# Patient Record
Sex: Male | Born: 1990
Health system: Southern US, Community
[De-identification: ages and names within clinical notes are randomized; demographics above are authoritative.]

## PROBLEM LIST (undated history)

## (undated) DIAGNOSIS — F1910 Other psychoactive substance abuse, uncomplicated: Secondary | ICD-10-CM

## (undated) DIAGNOSIS — N529 Male erectile dysfunction, unspecified: Secondary | ICD-10-CM

## (undated) DIAGNOSIS — E559 Vitamin D deficiency, unspecified: Secondary | ICD-10-CM

## (undated) DIAGNOSIS — E785 Hyperlipidemia, unspecified: Secondary | ICD-10-CM

## (undated) DIAGNOSIS — F419 Anxiety disorder, unspecified: Secondary | ICD-10-CM

## (undated) DIAGNOSIS — M419 Scoliosis, unspecified: Secondary | ICD-10-CM

## (undated) HISTORY — DX: Male erectile dysfunction, unspecified: N52.9

## (undated) HISTORY — DX: Hyperlipidemia, unspecified: E78.5

## (undated) HISTORY — DX: Other psychoactive substance abuse, uncomplicated: F19.10

## (undated) HISTORY — DX: Scoliosis, unspecified: M41.9

## (undated) HISTORY — DX: Vitamin D deficiency, unspecified: E55.9

## (undated) HISTORY — PX: WISDOM TOOTH EXTRACTION: SHX21

## (undated) HISTORY — DX: Anxiety disorder, unspecified: F41.9

---

## 2011-03-15 ENCOUNTER — Ambulatory Visit (INDEPENDENT_AMBULATORY_CARE_PROVIDER_SITE_OTHER): Payer: BC Managed Care – PPO | Admitting: Internal Medicine

## 2011-03-15 ENCOUNTER — Encounter: Payer: Self-pay | Admitting: Internal Medicine

## 2011-03-15 DIAGNOSIS — Z0000 Encounter for general adult medical examination without abnormal findings: Secondary | ICD-10-CM | POA: Insufficient documentation

## 2011-03-15 DIAGNOSIS — F1910 Other psychoactive substance abuse, uncomplicated: Secondary | ICD-10-CM | POA: Insufficient documentation

## 2011-03-15 DIAGNOSIS — J45909 Unspecified asthma, uncomplicated: Secondary | ICD-10-CM | POA: Insufficient documentation

## 2011-03-15 NOTE — Assessment & Plan Note (Addendum)
recommend  to get shot records Counseled:  STE diet, exercise, elevated  BMI Risk of tobacco Safe sex, safe driving Labs

## 2011-03-15 NOTE — Assessment & Plan Note (Signed)
Praised for getting help !

## 2011-03-15 NOTE — Progress Notes (Signed)
  Subjective:    Patient ID: Bryan Taylor, male    DOB: [birth year], 20 y.o.   MRN: [id number]  HPI CPX  Past Medical History  Diagnosis Date  . Drug abuse     Rx opioids, clean since  mid 2011  . Asthma    Past Surgical History  Procedure Date  . Wisdom tooth extraction    History   Social History  . Marital Status: Single    Spouse Name: N/A    Number of Children: 0  . Years of Education: N/A   Occupational History  . student [employer], has ajob    Social History Main Topics  . Smoking status: Current Everyday Smoker -- 1.0 packs/day    Types: Cigarettes  . Smokeless tobacco: Not on file  . Alcohol Use: No  . Drug Use: No     no recent   . Sexually Active: Not on file   Other Topics Concern  . Not on file   Social History Narrative  . No narrative on file   Family History  Problem Relation Age of Onset  . Diabetes Neg Hx   . Coronary artery disease Neg Hx   . Colon cancer Neg Hx   . Prostate cancer Neg Hx      Review of Systems  Constitutional: Negative for fever, fatigue and unexpected weight change.  Respiratory: Negative for cough and wheezing.   Cardiovascular: Negative for chest pain and leg swelling.  Gastrointestinal: Negative for abdominal pain and blood in stool.  Genitourinary: Negative for hematuria and difficulty urinating.  Psychiatric/Behavioral:       No anxiety-depression       Objective:   Physical Exam  Constitutional: He is oriented to person, place, and time. He appears well-developed. No distress.       Obese appearing  HENT:  Head: Normocephalic and atraumatic.  Neck: No thyromegaly present.  Cardiovascular: Normal rate, regular rhythm and normal heart sounds.   No murmur heard. Pulmonary/Chest: Effort normal and breath sounds normal. No respiratory distress. He has no wheezes. He has no rales.  Abdominal: Soft. Bowel sounds are normal. He exhibits no distension. There is no tenderness. There is no rebound and no guarding.    Musculoskeletal: He exhibits no edema.  Neurological: He is alert and oriented to person, place, and time.  Skin: Skin is warm and dry. He is not diaphoretic.  Psychiatric: He has a normal mood and affect. His behavior is normal. Judgment and thought content normal.          Assessment & Plan:

## 2011-03-15 NOTE — Assessment & Plan Note (Signed)
Only exacerbated if exposed to certain pets

## 2011-03-16 LAB — CBC WITH DIFFERENTIAL/PLATELET
Basophils Relative: 0.8 % (ref 0.0–3.0)
Eosinophils Relative: 1.8 % (ref 0.0–5.0)
Lymphocytes Relative: 41.5 % (ref 12.0–46.0)
MCV: 88.9 fl (ref 78.0–100.0)
Monocytes Absolute: 0.5 10*3/uL (ref 0.1–1.0)
Monocytes Relative: 9.7 % (ref 3.0–12.0)
Neutrophils Relative %: 46.2 % (ref 43.0–77.0)
RBC: 5.16 Mil/uL (ref 4.22–5.81)
WBC: 5.3 10*3/uL (ref 4.5–10.5)

## 2011-03-16 LAB — COMPREHENSIVE METABOLIC PANEL
Albumin: 4.7 g/dL (ref 3.5–5.2)
BUN: 13 mg/dL (ref 6–23)
CO2: 26 mEq/L (ref 19–32)
Calcium: 9.6 mg/dL (ref 8.4–10.5)
Chloride: 105 mEq/L (ref 96–112)
Glucose, Bld: 95 mg/dL (ref 70–99)
Potassium: 4.6 mEq/L (ref 3.5–5.1)
Total Protein: 7.7 g/dL (ref 6.0–8.3)

## 2011-03-16 LAB — TSH: TSH: 0.79 u[IU]/mL (ref 0.35–5.50)

## 2011-03-16 LAB — CHOLESTEROL, TOTAL: Cholesterol: 174 mg/dL (ref 0–200)

## 2011-04-11 ENCOUNTER — Telehealth: Payer: Self-pay | Admitting: Internal Medicine

## 2011-04-11 NOTE — Telephone Encounter (Signed)
Noted, please call him tomorrow if he does not keep his appointment

## 2011-04-11 NOTE — Telephone Encounter (Signed)
Pt c/o swelling and discomfort in scrotum area x weeks . Pt denies any recent injury, redness, urinary issue, tenderness or extreme pain. Pt scheduled for OV tomorrow but advise if symptoms worsen he needs to be evaluated in ED/UC. Pt ok

## 2011-04-12 ENCOUNTER — Ambulatory Visit (HOSPITAL_BASED_OUTPATIENT_CLINIC_OR_DEPARTMENT_OTHER)
Admission: RE | Admit: 2011-04-12 | Discharge: 2011-04-12 | Disposition: A | Discharge status: Home / Self Care | Payer: BC Managed Care – PPO | Source: Ambulatory Visit | Attending: Internal Medicine | Admitting: Internal Medicine

## 2011-04-12 ENCOUNTER — Other Ambulatory Visit: Payer: Self-pay | Admitting: Internal Medicine

## 2011-04-12 ENCOUNTER — Ambulatory Visit (INDEPENDENT_AMBULATORY_CARE_PROVIDER_SITE_OTHER)
Admission: RE | Admit: 2011-04-12 | Discharge: 2011-04-12 | Disposition: A | Discharge status: Home / Self Care | Payer: BC Managed Care – PPO | Source: Ambulatory Visit | Attending: Internal Medicine | Admitting: Internal Medicine

## 2011-04-12 ENCOUNTER — Ambulatory Visit (INDEPENDENT_AMBULATORY_CARE_PROVIDER_SITE_OTHER): Payer: BC Managed Care – PPO | Admitting: Internal Medicine

## 2011-04-12 ENCOUNTER — Encounter: Payer: Self-pay | Admitting: Internal Medicine

## 2011-04-12 VITALS — BP 136/82 | HR 98 | Temp 97.9°F | Resp 16 | Wt 226.0 lb

## 2011-04-12 DIAGNOSIS — N509 Disorder of male genital organs, unspecified: Secondary | ICD-10-CM

## 2011-04-12 DIAGNOSIS — N50819 Testicular pain, unspecified: Secondary | ICD-10-CM | POA: Insufficient documentation

## 2011-04-12 DIAGNOSIS — N508 Other specified disorders of male genital organs: Secondary | ICD-10-CM | POA: Insufficient documentation

## 2011-04-12 LAB — POCT URINALYSIS DIPSTICK
Blood, UA: NEGATIVE
Nitrite, UA: NEGATIVE
Spec Grav, UA: 1.025
Urobilinogen, UA: 0.2
pH, UA: 5

## 2011-04-12 MED ORDER — CIPROFLOXACIN HCL 500 MG PO TABS: 500.0000 mg | ORAL_TABLET | Freq: Two times a day (BID) | ORAL | Status: DC

## 2011-04-12 NOTE — Progress Notes (Signed)
  Subjective:    Patient ID: Bryan Taylor, male    DOB: September 05, 1990, 20 y.o.   MRN: [id number]  HPI 3 weeks history of on and off discomfort for the right testicle, it seems to be slightly swollen on and off, mostly at the posterior aspect of the testicle. No history of previous problems like that, no history of recent injury, is not taking any medication for it. Denies been recently sexually active.   Past Medical History  Diagnosis Date  . Drug abuse     Rx opioids, clean since  mid 2011  . Asthma    Past Surgical History  Procedure Date  . Wisdom tooth extraction       Review of Systems Denies fever or chills, no genital rash. No nausea or vomiting. Occasional diarrhea which is nothing to do for pain. No dysuria, gross hematuria, penile discharge      Objective:   Physical Exam  Constitutional: He is oriented to person, place, and time. He appears well-developed and well-nourished.  Abdominal: Soft. Bowel sounds are normal. He exhibits no distension. There is no rebound and no guarding.       Mild tenderness at the left lower quadrant without mass or rebound initially; inconsistent finding, when I reexamined the area there was  No tenderness  Genitourinary:       Growing without lymphadenopathies or hernia. Penis without discharge or rash Right testicle is slightly larger than the left, slightly more sensitive than L but not really tender. Epididymis seems normal except for the fact that is more sensitive. Left testicle normal  Musculoskeletal: He exhibits no edema.  Neurological: He is alert and oriented to person, place, and time.  Skin: Skin is warm and dry.  Psychiatric: He has a normal mood and affect. His behavior is normal. Judgment and thought content normal.          Assessment & Plan:

## 2011-04-12 NOTE — Assessment & Plan Note (Signed)
3 weeks history of right testicular discomfort, on exam the right testicle is slightly more sensitive. It is also larger, a chronic finding according to the patient. DDX subacute orchitis, epididymitis versus other etiologies. Plan: UA, urine culture, D&C Start empiric treatment with antibiotics Ultrasound

## 2011-04-12 NOTE — Telephone Encounter (Signed)
Pt arrived for OV today.

## 2011-04-12 NOTE — Patient Instructions (Signed)
Call if no better in few days, call if you are not 100% better in 3 weeks

## 2011-04-14 LAB — CULTURE, URINE COMPREHENSIVE

## 2011-04-15 ENCOUNTER — Other Ambulatory Visit: Payer: Self-pay | Admitting: *Deleted

## 2011-04-15 DIAGNOSIS — N390 Urinary tract infection, site not specified: Secondary | ICD-10-CM

## 2011-04-15 MED ORDER — CIPROFLOXACIN HCL 500 MG PO TABS: 500.0000 mg | ORAL_TABLET | Freq: Two times a day (BID) | ORAL | Status: AC

## 2011-04-29 ENCOUNTER — Telehealth: Payer: Self-pay | Admitting: *Deleted

## 2011-04-29 NOTE — Telephone Encounter (Signed)
Spoke w/patient about future Urine Culture-finished ABX yesterday and has had a little discomfort as before. Explained that he should give himself a couple of days after finishing ABX & if he experienced any symptoms as before, to make lab appointment [order has been placed] and come in earlier than the one month recheck as directed at 04/15/11 w/lab results.

## 2011-05-05 ENCOUNTER — Other Ambulatory Visit: Payer: Self-pay | Admitting: *Deleted

## 2011-05-05 DIAGNOSIS — N390 Urinary tract infection, site not specified: Secondary | ICD-10-CM

## 2011-05-06 ENCOUNTER — Other Ambulatory Visit (INDEPENDENT_AMBULATORY_CARE_PROVIDER_SITE_OTHER): Payer: BC Managed Care – PPO

## 2011-05-06 DIAGNOSIS — N390 Urinary tract infection, site not specified: Secondary | ICD-10-CM

## 2011-05-06 LAB — POCT URINALYSIS DIPSTICK
Glucose, UA: NEGATIVE
Leukocytes, UA: NEGATIVE
Nitrite, UA: NEGATIVE
Protein, UA: NEGATIVE
Urobilinogen, UA: 0.2

## 2011-05-06 NOTE — Progress Notes (Signed)
Labs only

## 2011-05-08 LAB — CULTURE, URINE COMPREHENSIVE
Colony Count: NO GROWTH
Organism ID, Bacteria: NO GROWTH

## 2011-05-09 NOTE — Progress Notes (Signed)
Quick Note:  Pt aware ______ 

## 2012-01-19 IMAGING — US US SCROTUM
1 series · 13 of 25 positions shown · non-contrast
Comparison: None.

CLINICAL DATA: Bilateral testicular discomfort for three weeks,
right greater than left, no known injury

SCROTAL ULTRASOUND
DOPPLER ULTRASOUND OF THE TESTICLES
TECHNIQUE: Complete ultrasound examination of the testicles,
epididymis, and other scrotal structures was performed.  Color and
spectral Doppler ultrasound were also utilized to evaluate blood
flow to the testicles.

[Series 1: us scrotum · 0.08mm/px · 13 of 27 slices shown]
[im 1/27]
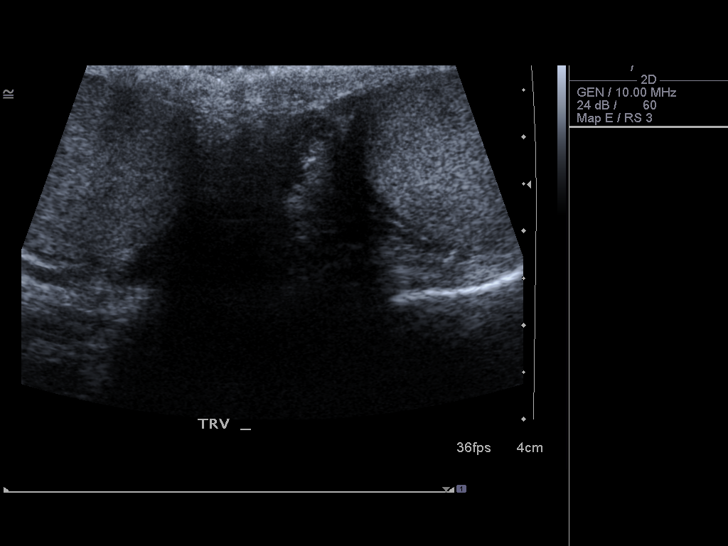
[im 3/27]
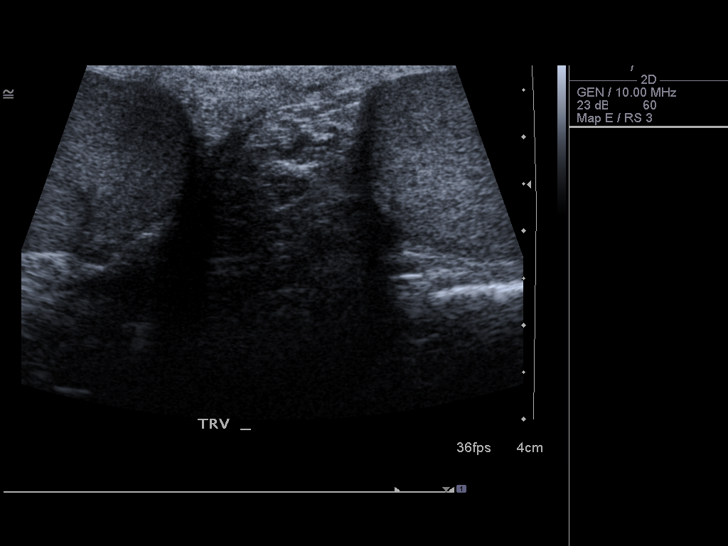
[im 5/27]
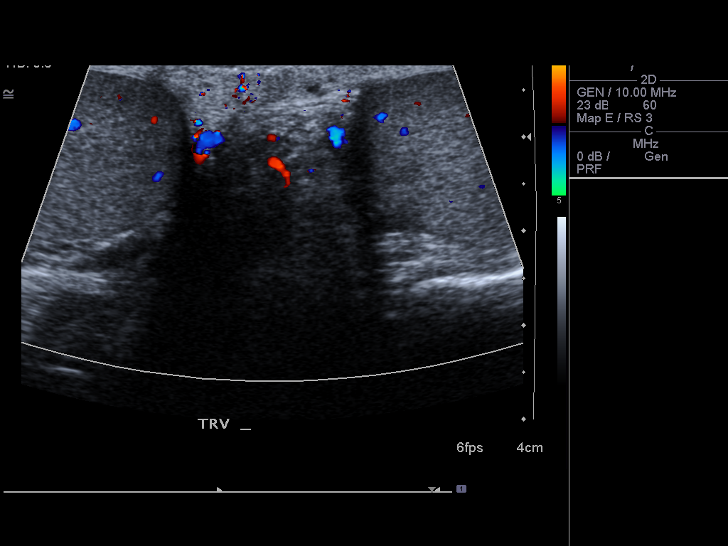
[im 7/27]
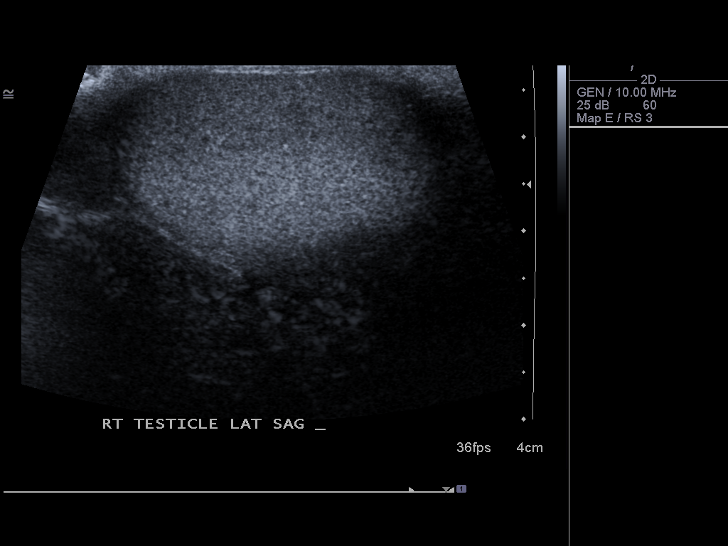
[im 9/27]
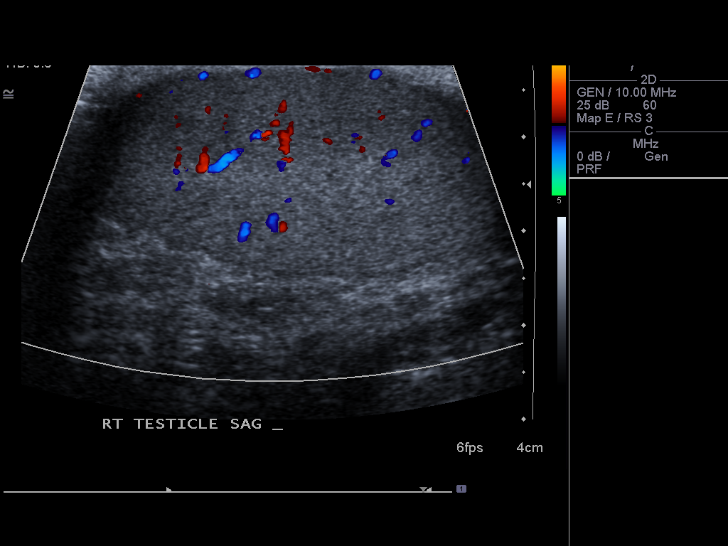
[im 11/27]
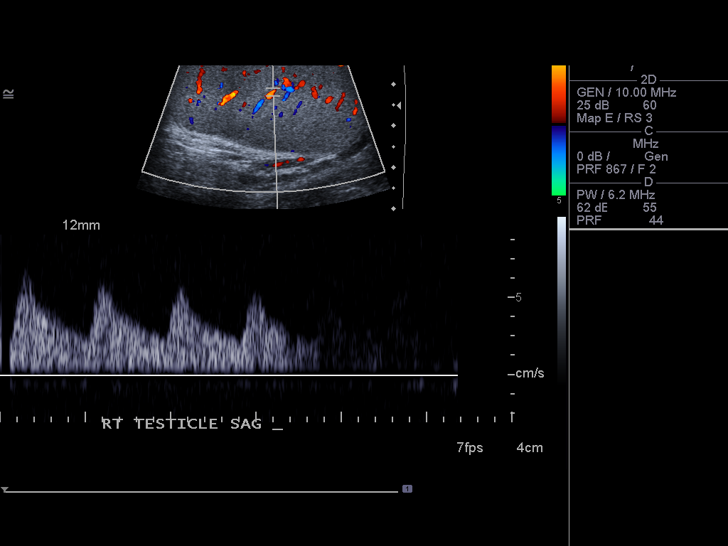
[im 14/27]
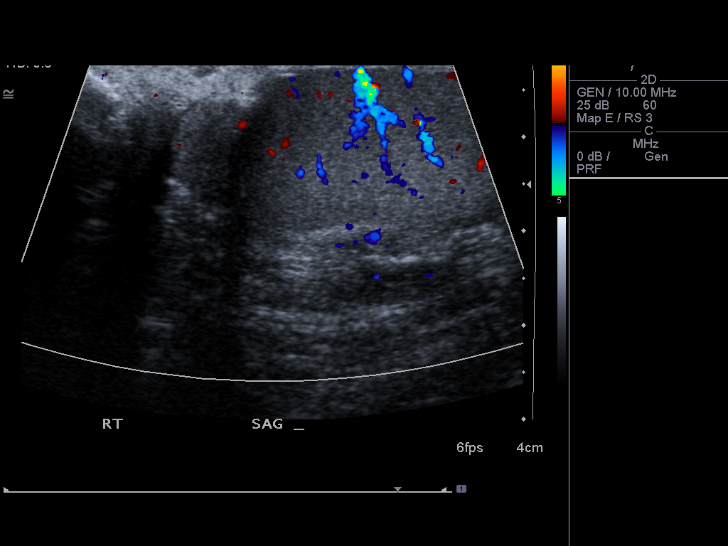
[im 16/27]
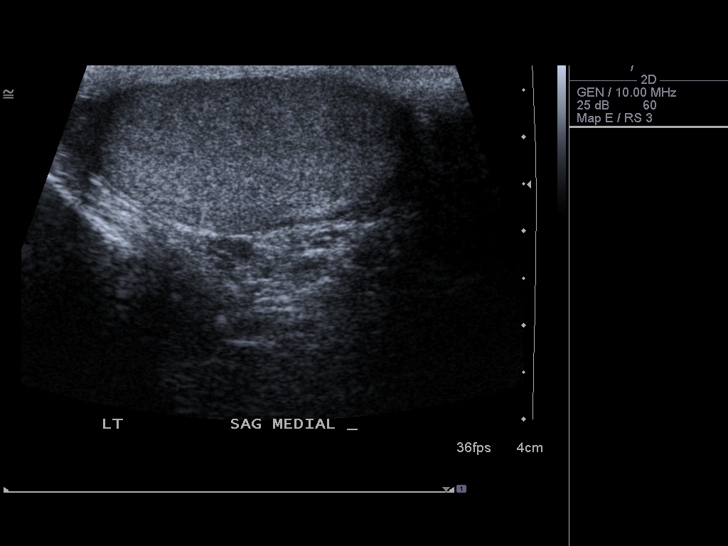
[im 18/27]
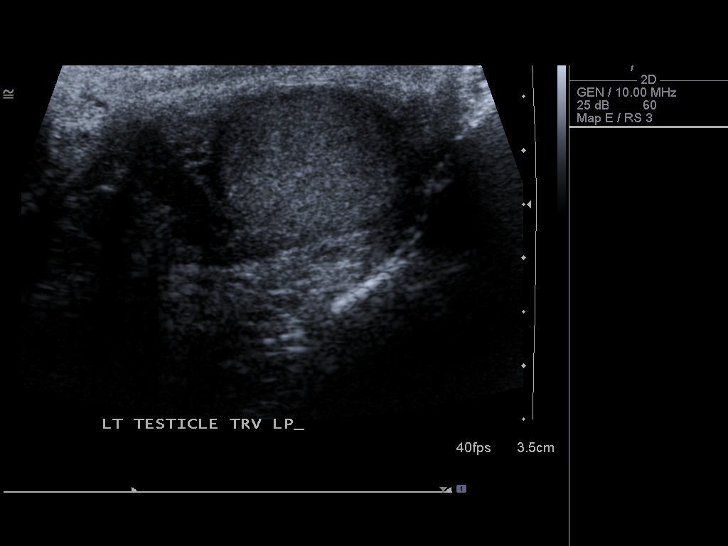
[im 20/27]
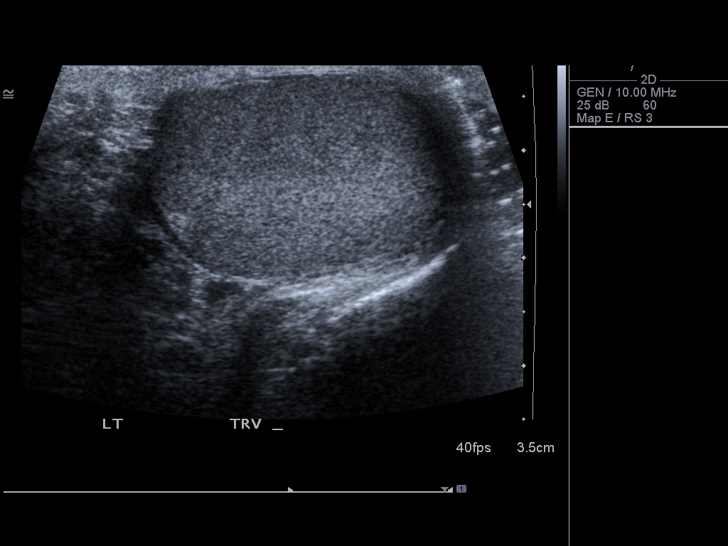
[im 22/27]
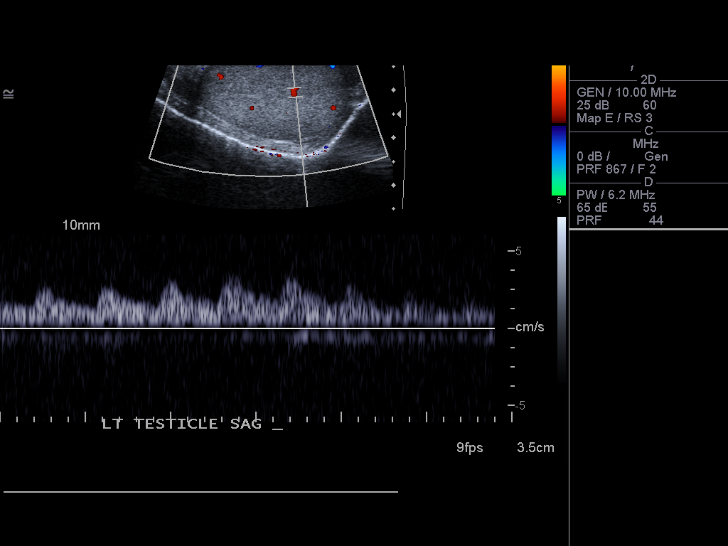
[im 24/27]
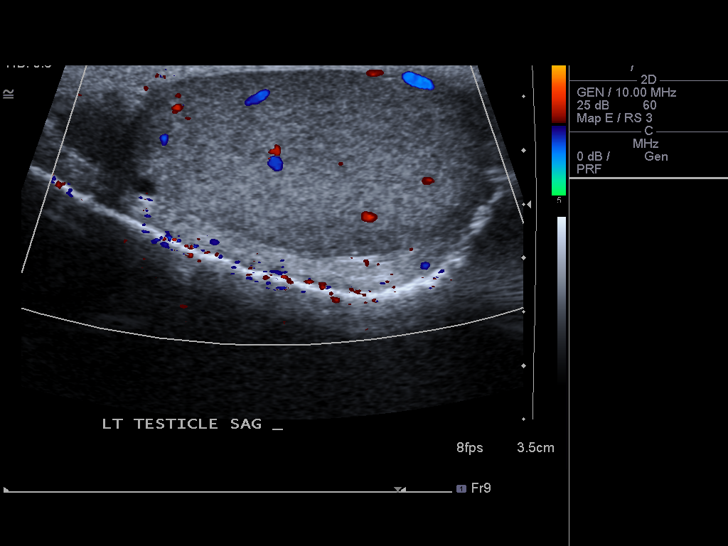
[im 27/27]
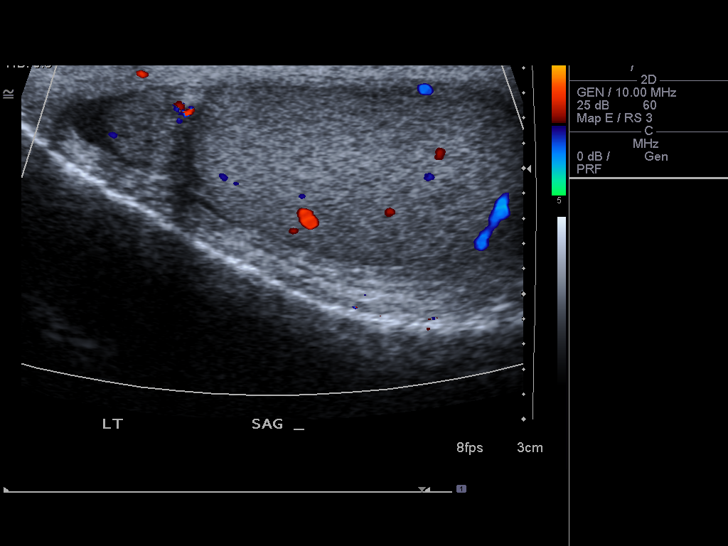

[13 of 25 positions shown; findings below may reference images not displayed]

FINDINGS: The right testicle demonstrates homogeneous echotexture and is
normal in size, measuring 4.3 x 2.5 x 3.1 cm.  No discrete hyper or
hypoechoic intratesticular mass identified.  Normal arterial and
venous waveforms are documented within the right testicle. The
right epididymis is normal in size and echotexture.   No right-
sided hydrocele or varicocele.

The left testicle demonstrates homogeneous echotexture and is
normal in size, measuring 4.1 x 2.0 x 2.2 cm.  No discrete hyper or
hypoechoic intracystic mass identified.  Normal arterial and venous
waveforms are documented within the left testicle. The left
epididymis is normal in size and echotexture.  No left-sided
hydrocele or varicocele.
IMPRESSION: No explanation for patient's bilateral testicular pain,
specifically, no evidence of testicular mass or torsion.

## 2013-03-20 ENCOUNTER — Encounter: Payer: BC Managed Care – PPO | Admitting: Internal Medicine

## 2013-03-22 ENCOUNTER — Encounter: Payer: Self-pay | Admitting: Internal Medicine

## 2013-03-22 ENCOUNTER — Ambulatory Visit (INDEPENDENT_AMBULATORY_CARE_PROVIDER_SITE_OTHER): Payer: BC Managed Care – PPO | Admitting: Internal Medicine

## 2013-03-22 VITALS — BP 120/70 | HR 91 | Temp 98.2°F | Ht 72.75 in | Wt 179.4 lb

## 2013-03-22 DIAGNOSIS — Z0000 Encounter for general adult medical examination without abnormal findings: Secondary | ICD-10-CM

## 2013-03-22 LAB — COMPREHENSIVE METABOLIC PANEL
BUN: 11 mg/dL (ref 6–23)
CO2: 29 mEq/L (ref 19–32)
Creatinine, Ser: 0.9 mg/dL (ref 0.4–1.5)
GFR: 117.03 mL/min (ref >60.00)
Glucose, Bld: 77 mg/dL (ref 70–99)
Sodium: 140 mEq/L (ref 135–145)
Total Bilirubin: 0.9 mg/dL (ref 0.3–1.2)
Total Protein: 8 g/dL (ref 6.0–8.3)

## 2013-03-22 LAB — CHOLESTEROL, TOTAL: Cholesterol: 221 mg/dL — ABNORMAL HIGH (ref 0–200)

## 2013-03-22 NOTE — Patient Instructions (Addendum)
Safe Sex Safe sex is about reducing the risk of giving or getting a sexually transmitted disease (STD). STDs are spread through sexual contact involving the genitals, mouth, or rectum. Some STDS can be cured and others cannot. Safe sex can also prevent unintended pregnancies.  SAFE SEX PRACTICES  Limit your sexual activity to only one partner who is only having sex with you.  Talk to your partner about their past partners, past STDs, and drug use.  Use a condom every time you have sexual intercourse. This includes vaginal, oral, and anal sexual activity. Both females and males should wear condoms during oral sex. Only use latex or polyurethane condoms and water-based lubricants. Petroleum-based lubricants or oils used to lubricate a condom will weaken the condom and increase the chance that it will break. The condom should be in place from the beginning to the end of sexual activity. Wearing a condom reduces, but does not completely eliminate, your risk of getting or giving a STD. STDs can be spread by contact with skin of surrounding areas.  Get vaccinated for hepatitis B and HPV.  Avoid alcohol and recreational drugs which can affect your judgement. You may forget to use a condom or participate in high-risk sex.  For females, avoid douching after sexual intercourse. Douching can spread an infection farther into the reproductive tract.  Check your body for signs of sores, blisters, rashes, or unusual discharge. See your caregiver if you notice any of these signs.  Avoid sexual contact if you have symptoms of an infection or are being treated for an STD. If you or your partner has herpes, avoid sexual contact when blisters are present. Use condoms at all other times.  See your caregiver for regular screenings, examinations, and tests for STDs. Before having sex with a new partner, each of you should be screened for STDs and talk about the results with your partner. BENEFITS OF SAFE SEX    There is less of a chance of getting or giving an STD.  You can prevent unwanted or unintended pregnancies.  By discussing safer sex concerns with your partner, you may increase feelings of intimacy, comfort, trust, and honesty between the both of you. Document Released: 09/15/2004 Document Revised: 05/02/2012 Document Reviewed: 01/30/2012 [facility] Patient Information 2014 [place], [place].  Testicular Problems and Self-Exam Men can examine themselves easily and effectively with positive results. Monthly exams detect problems early and save lives. There are numerous causes of swelling in the testicle. Testicular cancer usually appears as a firm painless lump in the front part of the testicle. This may feel like a dull ache or heavy feeling located in the lower abdomen (belly), groin, or scrotum.  The risk is greater in men with undescended testicles and it is more common in young men. It is responsible for almost a fifth of cancers in males between ages 73 and 75. Other common causes of swellings, lumps, and testicular pain include injuries, inflammation (soreness) from infection, hydrocele, and torsion. These are a few of the reasons to do monthly self-examination of the testicles. The exam only takes minutes and could add years to your life. Get in the habit! SELF-EXAMINATION OF THE TESTICLES The testicles are easiest to examine after warm baths or showers and are more difficult to examine when you are cold. This is because the muscles attached to the testicles retract and pull them up higher or into the abdomen. While standing, roll one testicle between the thumb and forefinger. Feel for lumps, swelling,  or discomfort. A normal testicle is egg shaped and feels firm. It is smooth and not tender. The spermatic cord can be felt as a firm spaghetti-like cord at the back of the testicle. It is also important to examine your groins. This is the crease between the front of your leg and your abdomen. Also,  feel for enlarged lymph nodes (glands). Enlarged nodes are also a cause for you to see your caregiver for evaluation.  Self-examination of the testicles and groin areas on a regular basis will help you to know what your own testicles and groins feel like. This will help you pick up an abnormality (difference) at an earlier stage. Early discovery is the key to curing this cancer or treating other conditions. Any lump, change, or swelling in the testicle calls for immediate evaluation by your caregiver. Cancer of the testicle does not result in impotence and it does not prevent normal intercourse or prevent having children. If your caregiver feels that medical treatment or chemotherapy could lead to infertility, sperm can be frozen for future use. It is necessary to see a caregiver as soon as possible after the discovery of a lump in a testicle. Document Released: 11/14/2000 Document Revised: 10/31/2011 Document Reviewed: 08/09/2008 [facility] Patient Information 2014 [place], [place].

## 2013-03-22 NOTE — Assessment & Plan Note (Addendum)
Immunizations record reviewed and scanned. Not update on Tdap per records but states he had one < few years ago  Labs (not fasting) Counseled: diet-exercise-safe driving-safe sex Doing great as far staying away from substance abuse--- praised

## 2013-03-22 NOTE — Progress Notes (Signed)
  Subjective:    Patient ID: Bryan Taylor, male    DOB: 1991-03-04, 22 y.o.   MRN: [id number]  HPI Complete physical exam  Past Medical History  Diagnosis Date  . Drug abuse     Rx opioids, clean since  mid 2011  . Asthma    Past Surgical History  Procedure Laterality Date  . Wisdom tooth extraction     History   Social History  . Marital Status: Single    Spouse Name: N/A    Number of Children: 0  . Years of Education: N/A   Occupational History  . [employer], part time jobs   .     Social History Main Topics  . Smoking status: Current Some Day Smoker    Types: Cigarettes  . Smokeless tobacco: Never Used     Comment: quitting, smokes sporadically  . Alcohol Use: No  . Drug Use: No     Comment: no recent   . Sexually Active: Not on file   Other Topics Concern  . Not on file   Social History Narrative  . No narrative on file   Family History  Problem Relation Age of Onset  . Diabetes Neg Hx   . Coronary artery disease Neg Hx   . Colon cancer Neg Hx   . Prostate cancer Neg Hx      Review of Systems Since the last time he was here he is doing great. He remains very active. Has lost a significant amount of weight in the last 2 years. He has a history of substance abuse, he remains abuse -free History of asthma, has not used his inhaler in years. Occasionally chest congestion with exposure to pets, no chest pain. No nausea, vomiting, diarrhea blood in the stools.     Objective:   Physical Exam BP 120/70  Pulse 91  Temp(Src) 98.2 F (36.8 C) (Oral)  Ht 6' 0.75" (1.848 m)  Wt 179 lb 6.4 oz (81.375 kg)  BMI 23.83 kg/m2  SpO2 96%  General -- alert, well-developed, NAD , healthy appearing Neck --no thyromegaly Lungs -- normal respiratory effort, no intercostal retractions, no accessory muscle use, and normal breath sounds.   Heart-- normal rate, regular rhythm, no murmur, and no gallop.   Abdomen--soft, non-tender, no distention  Extremities--  no pretibial edema bilaterally  Neurologic-- alert & oriented X3 and strength normal in all extremities. Psych-- Cognition and judgment appear intact. Alert and cooperative with normal attention span and concentration.  not anxious appearing and not depressed appearing.      Assessment & Plan:

## 2013-03-25 ENCOUNTER — Telehealth: Payer: Self-pay | Admitting: *Deleted

## 2013-03-25 DIAGNOSIS — R7989 Other specified abnormal findings of blood chemistry: Secondary | ICD-10-CM

## 2013-03-25 NOTE — Telephone Encounter (Signed)
Message copied by Shirlee More I on Mon Mar 25, 2013 11:11 AM ------      Message from: Willow Ora E      Created: Sun Mar 24, 2013  9:58 AM       Please call the patient:      Cholesterol has increased to 221 despite loosing weight and staying active, needs to watch his diet (low fat!).      One of the liver tests is increased, please arrange LFTs to recheck 3 months from now, DX elevated LFTs      The HIV test and other STD related results are negative! ------

## 2013-03-25 NOTE — Telephone Encounter (Signed)
Discussed with patient, verbalized understanding. Orders placed and lab apt. Scheduled for 06/14/13

## 2013-04-24 ENCOUNTER — Ambulatory Visit (INDEPENDENT_AMBULATORY_CARE_PROVIDER_SITE_OTHER): Payer: BC Managed Care – PPO

## 2013-04-24 DIAGNOSIS — Z23 Encounter for immunization: Secondary | ICD-10-CM

## 2013-04-30 ENCOUNTER — Telehealth: Payer: Self-pay | Admitting: Internal Medicine

## 2013-04-30 NOTE — Telephone Encounter (Signed)
im sending to kim

## 2013-04-30 NOTE — Telephone Encounter (Signed)
Patient needs for he record of his TDap injection to be put in the [facility] so that his college can access it. Needs this done asap. Please advise.

## 2013-05-01 NOTE — Telephone Encounter (Signed)
Done  KP

## 2013-06-14 ENCOUNTER — Other Ambulatory Visit (INDEPENDENT_AMBULATORY_CARE_PROVIDER_SITE_OTHER): Payer: BC Managed Care – PPO

## 2013-06-14 DIAGNOSIS — R7989 Other specified abnormal findings of blood chemistry: Secondary | ICD-10-CM

## 2013-06-14 LAB — HEPATIC FUNCTION PANEL
AST: 19 U/L (ref 0–37)
Bilirubin, Direct: 0 mg/dL (ref 0.0–0.3)
Total Bilirubin: 0.9 mg/dL (ref 0.3–1.2)

## 2013-06-17 ENCOUNTER — Encounter: Payer: Self-pay | Admitting: *Deleted

## 2013-09-25 ENCOUNTER — Encounter: Payer: Self-pay | Admitting: Internal Medicine

## 2013-09-25 ENCOUNTER — Ambulatory Visit (INDEPENDENT_AMBULATORY_CARE_PROVIDER_SITE_OTHER): Payer: BC Managed Care – PPO | Admitting: Internal Medicine

## 2013-09-25 VITALS — BP 120/81 | HR 90 | Temp 97.9°F | Wt 173.0 lb

## 2013-09-25 DIAGNOSIS — Z113 Encounter for screening for infections with a predominantly sexual mode of transmission: Secondary | ICD-10-CM

## 2013-09-25 DIAGNOSIS — L0293 Carbuncle, unspecified: Secondary | ICD-10-CM

## 2013-09-25 DIAGNOSIS — L0292 Furuncle, unspecified: Secondary | ICD-10-CM

## 2013-09-25 NOTE — Progress Notes (Signed)
Pre visit review using our clinic review tool, if applicable. No additional management support is needed unless otherwise documented below in the visit note. 

## 2013-09-25 NOTE — Patient Instructions (Signed)
Get your blood work before you leave  Use a OTC antibiotic ointment over the red spot you have Call if the area is not healing well

## 2013-09-25 NOTE — Progress Notes (Signed)
   Subjective:    Patient ID: Bryan Taylor, male    DOB: [birth year], 23 y.o.   MRN: [id number]  HPI Acute visit Noted a red spot few days ago at the suprapubic area, this is not the first time this happens, no pain, he is quite concerned about STDs. Also would like to have his blood tested.  Past Medical History  Diagnosis Date  . Drug abuse     Rx opioids, clean since  mid 2011  . Asthma    Past Surgical History  Procedure Laterality Date  . Wisdom tooth extraction       Review of Systems Denies any dysuria, gross hematuria. No other rashes. No penile discharge. he is sexually active with his girlfriend, does not use condoms with her, no other sexual activity.     Objective:   Physical Exam BP 120/81  Pulse 90  Temp(Src) 97.9 F (36.6 C)  Wt 173 lb (78.472 kg)  SpO2 97% General -- alert, well-developed, NAD.  GU-- Scrotal contents and penis normal to inspection and palpation At the suprapubic area he has a single, superficial small skin irritation w/o d/c or blisters. No skin parasites  Neurologic--  alert & oriented X3. Speech normal, gait normal, strength normal in all extremities.  Psych-- Cognition and judgment appear intact. Cooperative with normal attention span and concentration. No anxious or depressed appearing.       Assessment & Plan:  Boil, Small superficial boil, no evidence of herpes on clinical grounds. Recommend local care with antibiotic ointments, if he is not better let  me know, if he ever has larger-painful boil or a rash---> needs to be seen.  STDs screening--requests a  HIV, will also check an RPR

## 2013-09-26 ENCOUNTER — Encounter: Payer: Self-pay | Admitting: *Deleted

## 2013-09-26 LAB — RPR

## 2013-09-26 LAB — HIV ANTIBODY (ROUTINE TESTING W REFLEX): HIV: NONREACTIVE

## 2013-09-27 ENCOUNTER — Telehealth: Payer: Self-pay | Admitting: Internal Medicine

## 2013-09-27 NOTE — Telephone Encounter (Signed)
Relevant patient education mailed to patient.  

## 2013-10-01 ENCOUNTER — Telehealth: Payer: Self-pay | Admitting: *Deleted

## 2013-10-01 MED ORDER — DOXYCYCLINE HYCLATE 100 MG PO TABS: 100.0000 mg | ORAL_TABLET | Freq: Two times a day (BID) | ORAL | Status: DC

## 2013-10-01 NOTE — Telephone Encounter (Signed)
Doxycycline 100 mg one by mouth twice a day #14, no refills

## 2013-10-01 NOTE — Telephone Encounter (Signed)
Patient called and is requesting an antibiotic. He states that he is not feeling better. Please advise. JG//CMA

## 2014-01-16 ENCOUNTER — Telehealth: Payer: Self-pay | Admitting: *Deleted

## 2014-01-16 NOTE — Telephone Encounter (Signed)
Patient walked into office and dropped of health examination certificate for [employer] public [employer] to be completed.  Patient will need an office visit. Called and left message for patient to please call back and schedule. A 30 minute slot is appropriate for this appointment. JG//CMA

## 2014-01-20 ENCOUNTER — Ambulatory Visit (INDEPENDENT_AMBULATORY_CARE_PROVIDER_SITE_OTHER): Payer: BC Managed Care – PPO | Admitting: Internal Medicine

## 2014-01-20 ENCOUNTER — Encounter: Payer: Self-pay | Admitting: Internal Medicine

## 2014-01-20 VITALS — BP 111/74 | HR 80 | Temp 98.0°F | Ht 73.1 in | Wt 173.0 lb

## 2014-01-20 DIAGNOSIS — J45909 Unspecified asthma, uncomplicated: Secondary | ICD-10-CM

## 2014-01-20 DIAGNOSIS — Z0000 Encounter for general adult medical examination without abnormal findings: Secondary | ICD-10-CM

## 2014-01-20 DIAGNOSIS — F1910 Other psychoactive substance abuse, uncomplicated: Secondary | ICD-10-CM

## 2014-01-20 MED ORDER — ALBUTEROL SULFATE HFA 108 (90 BASE) MCG/ACT IN AERS: 2.0000 | INHALATION_SPRAY | Freq: Four times a day (QID) | RESPIRATORY_TRACT | Status: DC | PRN

## 2014-01-20 NOTE — Progress Notes (Signed)
Pre visit review using our clinic review tool, if applicable. No additional management support is needed unless otherwise documented below in the visit note. 

## 2014-01-20 NOTE — Patient Instructions (Signed)
  Next visit is for a physical exam by 03-2014 ,  fasting Please make an appointment    

## 2014-01-20 NOTE — Assessment & Plan Note (Signed)
Essentially asymptomatic,   I did send a prescription for ventolin in case he needs it

## 2014-01-20 NOTE — Assessment & Plan Note (Signed)
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 doesn't need a PPD but if the system requires one, will call for a nurse visit

## 2014-01-20 NOTE — Progress Notes (Signed)
   Subjective:    Patient ID: Bryan Taylor, male    DOB: May 08, 1991, 23 y.o.   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 His of asthma,   occasional has a cough, wonders if needs a Rx    ROS Currently doing well. No fever or chills No weight loss No nausea, vomiting, diarrhea. No cough or sputum production. Vision is okay, he uses glasses to drive at night, sees the eye Dr. Regularly.  Past Medical History  Diagnosis Date  . Drug abuse     Rx opioids, clean since  mid 2011  . Asthma     Past Surgical History  Procedure Laterality Date  . Wisdom tooth extraction      History   Social History  . Marital Status: Single    Spouse Name: N/A    Number of Children: 0  . Years of Education: N/A   Occupational History  . [employer], part time jobs   .     Social History Main Topics  . Smoking status: Current Some Day Smoker    Types: Cigarettes  . Smokeless tobacco: Never Used     Comment: quitting, smokes sporadically  . Alcohol Use: No  . Drug Use: No     Comment: no recent   . Sexual Activity: Not on file   Other Topics Concern  . Not on file   Social History Narrative  . No narrative on file        Medication List       This list is accurate as of: 01/20/14 11:00 AM.  Always use your most recent med list.               albuterol 108 (90 BASE) MCG/ACT inhaler  Commonly known as:  VENTOLIN HFA  Inhale 2 puffs into the lungs every 6 (six) hours as needed for wheezing or shortness of breath.           Objective:   Physical Exam BP 111/74  Pulse 80  Temp(Src) 98 F (36.7 C)  Ht 6' 1.1" (1.857 m)  Wt 173 lb (78.472 kg)  BMI 22.76 kg/m2  SpO2 99% General -- alert, well-developed, NAD.  HEENT-- hearing grossly normal Lungs -- normal respiratory effort, no intercostal retractions, no accessory muscle use, and normal breath sounds.    Heart-- normal rate, regular rhythm, no murmur.  Extremities-- no pretibial edema bilaterally  Neurologic--  alert & oriented X3. Speech normal, gait appropriate for age, strength symmetric and appropriate for age.   Psych-- Cognition and judgment appear intact. Cooperative with normal attention span and concentration. No anxious or depressed appearing.         Assessment & Plan:

## 2014-01-20 NOTE — Assessment & Plan Note (Signed)
Remains clean! praised

## 2014-02-17 ENCOUNTER — Ambulatory Visit (INDEPENDENT_AMBULATORY_CARE_PROVIDER_SITE_OTHER): Payer: BC Managed Care – PPO | Admitting: Internal Medicine

## 2014-02-17 ENCOUNTER — Encounter: Payer: Self-pay | Admitting: Internal Medicine

## 2014-02-17 VITALS — BP 108/66 | HR 86 | Temp 98.5°F | Wt 171.0 lb

## 2014-02-17 DIAGNOSIS — J029 Acute pharyngitis, unspecified: Secondary | ICD-10-CM

## 2014-02-17 DIAGNOSIS — J069 Acute upper respiratory infection, unspecified: Secondary | ICD-10-CM

## 2014-02-17 LAB — POCT RAPID STREP A (OFFICE): RAPID STREP A SCREEN: NEGATIVE

## 2014-02-17 MED ORDER — AZITHROMYCIN 250 MG PO TABS: ORAL_TABLET | ORAL | Status: DC

## 2014-02-17 NOTE — Progress Notes (Signed)
Pre visit review using our clinic review tool, if applicable. No additional management support is needed unless otherwise documented below in the visit note. 

## 2014-02-17 NOTE — Progress Notes (Signed)
   Subjective:    Patient ID: Bryan Taylor, male    DOB: 1991/07/10, 23 y.o.   MRN: [id number]  DOS:  02/17/2014 Type of  Visit: acute History: Symptoms started 3 days ago with sore throat, it is getting gradually worse. Also had mild diarrhea during the weekend, now is better. His roommate has also respiratory symptoms   ROS Denies fever or chills Some sinus pressure bilaterally but no nasal discharge. No actual chest symptoms like congestion or cough  Past Medical History  Diagnosis Date  . Drug abuse     Rx opioids, clean since  mid 2011  . Asthma     Past Surgical History  Procedure Laterality Date  . Wisdom tooth extraction      History   Social History  . Marital Status: Single    Spouse Name: N/A    Number of Children: 0  . Years of Education: N/A   Occupational History  . [employer], part time jobs   .     Social History Main Topics  . Smoking status: Current Some Day Smoker    Types: Cigarettes  . Smokeless tobacco: Never Used     Comment: quitting, smokes sporadically  . Alcohol Use: No  . Drug Use: No     Comment: no recent   . Sexual Activity: Not on file   Other Topics Concern  . Not on file   Social History Narrative  . No narrative on file        Medication List       This list is accurate as of: 02/17/14  5:38 PM.  Always use your most recent med list.               albuterol 108 (90 BASE) MCG/ACT inhaler  Commonly known as:  VENTOLIN HFA  Inhale 2 puffs into the lungs every 6 (six) hours as needed for wheezing or shortness of breath.     azithromycin 250 MG tablet  Commonly known as:  ZITHROMAX Z-PAK  2 tablets the first day, then one tablet daily for 4 additional days           Objective:   Physical Exam BP 108/66  Pulse 86  Temp(Src) 98.5 F (36.9 C)  Wt 171 lb (77.565 kg)  SpO2 96% General -- alert, well-developed, NAD.  HEENT-- Not pale. TMs normal, throat symmetric, no redness or discharge. Face  symmetric, sinuses not tender to palpation. Nose slt congested. Lungs -- normal respiratory effort, no intercostal retractions, no accessory muscle use, and No wheezing but a few rhonchi with cough Heart-- normal rate, regular rhythm, no murmur.   Extremities-- no pretibial edema bilaterally  Neurologic--  alert & oriented X3. Speech normal, gait appropriate for age, strength symmetric and appropriate for age.   Psych-- Cognition and judgment appear intact. Cooperative with normal attention span and concentration. No anxious or depressed appearing.       Assessment & Plan:   URI, Patient with symptoms consistent with URI, viral pharyngitis?. Strep test negative. He has a history of asthma but currently no wheezing. Plan:  see Instructions, if not better will start a Z-Pak.  Also encouraged to use albuterol as needed for wheezing

## 2014-02-17 NOTE — Patient Instructions (Signed)
Rest, fluids , tylenol If  cough, take Mucinex DM twice a day as needed  If congestion use OTC Nasocort: 2 nasal sprays on each side of the nose daily until you feel better   Take the antibiotic as prescribed  (zithromax) if not better in 3-4 days  Call if no better next week  Call anytime if the symptoms are severe

## 2014-04-22 ENCOUNTER — Encounter: Payer: Self-pay | Admitting: Internal Medicine

## 2014-04-22 ENCOUNTER — Ambulatory Visit (INDEPENDENT_AMBULATORY_CARE_PROVIDER_SITE_OTHER): Payer: BC Managed Care – PPO | Admitting: Internal Medicine

## 2014-04-22 VITALS — BP 118/62 | HR 78 | Temp 98.1°F | Wt 176.4 lb

## 2014-04-22 DIAGNOSIS — Z0000 Encounter for general adult medical examination without abnormal findings: Secondary | ICD-10-CM

## 2014-04-22 DIAGNOSIS — J4520 Mild intermittent asthma, uncomplicated: Secondary | ICD-10-CM

## 2014-04-22 NOTE — Assessment & Plan Note (Signed)
Not using albuterol at all at this point

## 2014-04-22 NOTE — Progress Notes (Signed)
Pre visit review using our clinic review tool, if applicable. No additional management support is needed unless otherwise documented below in the visit note. 

## 2014-04-22 NOTE — Patient Instructions (Signed)
Please schedule labs, fasting at your earliest convenience FLP, AST, ALT, CBC, TSH --- dx [id number]  Next visit in one year and as needed    Testicular Self-Exam A self-examination of your testicles involves looking at and feeling your testicles for abnormal lumps or swelling. Several things can cause swelling, lumps, or pain in your testicles. Some of these causes are:  Injuries.  Inflammation.  Infection.  Accumulation of fluids around your testicle (hydrocele).  Twisted testicles (testicular torsion).  Testicular cancer. Self-examination of the testicles and groin areas may be advised if you are at risk for testicular cancer. Risks for testicular cancer include:  An undescended testicle (cryptorchidism).  A history of previous testicular cancer.  A family history of testicular cancer. The testicles are easiest to examine after warm baths or showers and are more difficult to examine when you are cold. This is because the muscles attached to the testicles retract and pull them up higher or into the abdomen. Follow these steps while you are standing:  Hold your penis away from your body.  Roll one testicle between your thumb and forefinger, feeling the entire testicle.  Roll the other testicle between your thumb and forefinger, feeling the entire testicle. Feel for lumps, swelling, or discomfort. A normal testicle is egg shaped and feels firm. It is smooth and not tender. The spermatic cord can be felt as a firm spaghetti-like cord at the back of your testicle. It is also important to examine the crease between the front of your leg and your abdomen. Feel for any bumps that are tender. These could be enlarged lymph nodes.  Document Released: 11/14/2000 Document Revised: 04/10/2013 Document Reviewed: 01/28/2013 [facility] Patient Information 2015 [place], [place]. This information is not intended to replace advice given to you by your health care provider. Make sure you discuss any  questions you have with your health care provider.

## 2014-04-22 NOTE — Progress Notes (Signed)
   Subjective:    Patient ID: Bryan Taylor, male    DOB: [birth year], 23 y.o.   MRN: [id number]  DOS:  04/22/2014 Type of visit - description : CPX Interval history: pt is doing well   ROS  No  CP, SOB No palpitations, no syncope Denies  nausea, vomiting diarrhea, blood in the stools Asthma well controlled  (-) cough, sputum production (-) wheezing, chest congestion No dysuria, gross hematuria, difficulty urinating  No testicular pain, genital rash  No anxiety, depression    Past Medical History  Diagnosis Date  . Drug abuse     Rx opioids, clean since  mid 2011  . Asthma     Past Surgical History  Procedure Laterality Date  . Wisdom tooth extraction      History   Social History  . Marital Status: Single    Spouse Name: N/A    Number of Children: 0  . Years of Education: N/A   Occupational History  . [employer], part time jobs   .     Social History Main Topics  . Smoking status: Current Some Day Smoker    Types: Cigarettes  . Smokeless tobacco: Never Used     Comment: quitting, smokes sporadically  . Alcohol Use: No  . Drug Use: No     Comment: no recent   . Sexual Activity: Not on file   Other Topics Concern  . Not on file   Social History Narrative   Lives w/ room mates (off campus)     Family History  Problem Relation Age of Onset  . Diabetes Neg Hx   . Coronary artery disease Neg Hx   . Colon cancer Neg Hx   . Prostate cancer Neg Hx        Medication List       This list is accurate as of: 04/22/14  5:33 PM.  Always use your most recent med list.               albuterol 108 (90 BASE) MCG/ACT inhaler  Commonly known as:  VENTOLIN HFA  Inhale 2 puffs into the lungs every 6 (six) hours as needed for wheezing or shortness of breath.           Objective:   Physical Exam BP 118/62  Pulse 78  Temp(Src) 98.1 F (36.7 C) (Oral)  Wt 176 lb 5.9 oz (80 kg)  SpO2 98% General -- alert, well-developed, NAD.  Neck --no thyromegaly  , normal carotid pulse  HEENT-- Not pale Lungs -- normal respiratory effort, no intercostal retractions, no accessory muscle use, and normal breath sounds.  Heart-- normal rate, regular rhythm, no murmur.  Abdomen-- Not distended, good bowel sounds,soft, non-tender. Extremities-- no pretibial edema bilaterally  Neurologic--  alert & oriented X3. Speech normal, gait appropriate for age, strength symmetric and appropriate for age.  Psych-- Cognition and judgment appear intact. Cooperative with normal attention span and concentration. No anxious or depressed appearing.      Assessment & Plan:

## 2014-04-22 NOTE — Assessment & Plan Note (Signed)
Td 2014 Doing well with diet and exercise Labs Discussed safe driving, safe sex , self testicular exam Followup in one year and as needed

## 2014-04-30 ENCOUNTER — Other Ambulatory Visit (INDEPENDENT_AMBULATORY_CARE_PROVIDER_SITE_OTHER): Payer: BC Managed Care – PPO

## 2014-04-30 DIAGNOSIS — Z0000 Encounter for general adult medical examination without abnormal findings: Secondary | ICD-10-CM

## 2014-04-30 LAB — LIPID PANEL
CHOL/HDL RATIO: 4
Cholesterol: 186 mg/dL (ref 0–200)
HDL: 41.6 mg/dL (ref >39.00)
LDL Cholesterol: 126 mg/dL — ABNORMAL HIGH (ref 0–99)
NONHDL: 144.4
Triglycerides: 92 mg/dL (ref 0.0–149.0)
VLDL: 18.4 mg/dL (ref 0.0–40.0)

## 2014-04-30 LAB — CBC
HEMATOCRIT: 45.3 % (ref 39.0–52.0)
Hemoglobin: 15.3 g/dL (ref 13.0–17.0)
MCHC: 33.8 g/dL (ref 30.0–36.0)
MCV: 89.3 fl (ref 78.0–100.0)
Platelets: 210 10*3/uL (ref 150.0–400.0)
RBC: 5.07 Mil/uL (ref 4.22–5.81)
RDW: 13.4 % (ref 11.5–15.5)
WBC: 5.4 10*3/uL (ref 4.0–10.5)

## 2014-04-30 LAB — TSH: TSH: 0.39 u[IU]/mL (ref 0.35–4.50)

## 2014-04-30 LAB — ALT: ALT: 29 U/L (ref 0–53)

## 2014-04-30 LAB — AST: AST: 22 U/L (ref 0–37)

## 2014-12-11 ENCOUNTER — Telehealth: Payer: Self-pay | Admitting: *Deleted

## 2014-12-11 NOTE — Telephone Encounter (Signed)
Pt dropped off medical form for [employer] Summer 2016. Completed as much as possible and forwarded to Dr. Drue NovelPaz. JG//CMA

## 2014-12-16 DIAGNOSIS — Z7689 Persons encountering health services in other specified circumstances: Secondary | ICD-10-CM

## 2014-12-16 NOTE — Telephone Encounter (Signed)
Patient wanting to know if forms have been completed. [employer] # [phone]

## 2014-12-16 NOTE — Telephone Encounter (Signed)
Forms are with Dr. Drue NovelPaz currently. I will check status and inform Dr. Drue NovelPaz that pt needs form by end of week. JG//CMA

## 2014-12-16 NOTE — Telephone Encounter (Signed)
He states that he is needing forms back by end of week.

## 2014-12-18 NOTE — Telephone Encounter (Signed)
Completed forms completed and mailed, per pt's request, to pt's home address. Copy sent for scanning. JG//CMA

## 2014-12-25 ENCOUNTER — Encounter: Payer: Self-pay | Admitting: Internal Medicine

## 2014-12-25 ENCOUNTER — Ambulatory Visit (INDEPENDENT_AMBULATORY_CARE_PROVIDER_SITE_OTHER): Payer: BLUE CROSS/BLUE SHIELD | Admitting: Internal Medicine

## 2014-12-25 VITALS — BP 122/84 | HR 72 | Temp 97.7°F | Ht 73.0 in | Wt 185.5 lb

## 2014-12-25 DIAGNOSIS — F1910 Other psychoactive substance abuse, uncomplicated: Secondary | ICD-10-CM

## 2014-12-25 DIAGNOSIS — J4520 Mild intermittent asthma, uncomplicated: Secondary | ICD-10-CM | POA: Diagnosis not present

## 2014-12-25 MED ORDER — ALBUTEROL SULFATE HFA 108 (90 BASE) MCG/ACT IN AERS: 2.0000 | INHALATION_SPRAY | Freq: Four times a day (QID) | RESPIRATORY_TRACT | Status: DC | PRN
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
## 2014-12-25 NOTE — Assessment & Plan Note (Signed)
Not an issue at this point, he continue attending the "12 step meetings".

## 2014-12-25 NOTE — Assessment & Plan Note (Signed)
Not an issue at this point 

## 2014-12-25 NOTE — Progress Notes (Signed)
Pre visit review using our clinic review tool, if applicable. No additional management support is needed unless otherwise documented below in the visit note. 

## 2015-04-13 ENCOUNTER — Telehealth: Payer: Self-pay | Admitting: *Deleted

## 2015-04-13 ENCOUNTER — Ambulatory Visit (HOSPITAL_BASED_OUTPATIENT_CLINIC_OR_DEPARTMENT_OTHER)
Admission: RE | Admit: 2015-04-13 | Discharge: 2015-04-13 | Disposition: A | Discharge status: Home / Self Care | Payer: BLUE CROSS/BLUE SHIELD | Source: Ambulatory Visit | Attending: Internal Medicine | Admitting: Internal Medicine

## 2015-04-13 ENCOUNTER — Ambulatory Visit (INDEPENDENT_AMBULATORY_CARE_PROVIDER_SITE_OTHER): Payer: BLUE CROSS/BLUE SHIELD | Admitting: Internal Medicine

## 2015-04-13 ENCOUNTER — Encounter: Payer: Self-pay | Admitting: Internal Medicine

## 2015-04-13 VITALS — BP 120/72 | HR 80 | Temp 97.7°F | Resp 16 | Ht 73.0 in | Wt 195.8 lb

## 2015-04-13 DIAGNOSIS — R51 Headache: Secondary | ICD-10-CM | POA: Diagnosis not present

## 2015-04-13 DIAGNOSIS — R519 Headache, unspecified: Secondary | ICD-10-CM

## 2015-04-13 MED ORDER — MELOXICAM 15 MG PO TABS: 15.0000 mg | ORAL_TABLET | Freq: Every day | ORAL | Status: DC

## 2015-04-13 MED ORDER — CYCLOBENZAPRINE HCL 10 MG PO TABS: 10.0000 mg | ORAL_TABLET | Freq: Every evening | ORAL | Status: DC | PRN

## 2015-04-13 NOTE — Patient Instructions (Signed)
We are ordering a CT head to be done today  If it is normal, I will recommend the following treatment:  Meloxicam one time a day x 1 week, then as needed (antiinflammatory).  Always take it with food because may cause gastritis and ulcers.  If you notice nausea, stomach pain, change in the color of stools --->  Stop the medicine and let us know   Flexeril one  tablet at bedtime as needed  ( muscle  Relaxant)  Call if you're not back to normal within the next 2 weeks Call anytime if you have severe symptoms even if the CAT scan is normal.

## 2015-04-13 NOTE — Telephone Encounter (Signed)
CT results normal.  Patient notified that per office visit note patient can begin medications discussed at visit.

## 2015-04-13 NOTE — Telephone Encounter (Signed)
thx

## 2015-04-13 NOTE — Progress Notes (Signed)
Subjective:    Patient ID: Bryan Taylor, male    DOB: 12/24/1990, 24 y.o.   MRN: [id number]  DOS:  04/13/2015 Type of visit - description : Acute visit Interval history: Patient woke up 6 days ago with moderate neck pain, posterior, right-sided, it radiated to the right side of the head, jaw and TMJ area. Since then, the headache is a slightly better but he still has on and off pain at the other places. Today, is mostly around the jaw. The pain does not change with head motion or chewing. Denies any injury He went to see a chiropractor with the pain last week and they did some manipulation but he did not feel any better or worse. No associated nausea. No history of headaches but when asked about photo  or phonophobia he is not sure, a little?    Review of Systems Denies fever chills per se. No URI type of symptoms such as runny nose or sore throat. No rash anywhere No dizziness, diplopia, slurred speech or motor deficits. He did have some tenderness at the right trapezoid area  Past Medical History  Diagnosis Date  . Drug abuse     Rx opioids, clean since  mid 2011  . Asthma     Past Surgical History  Procedure Laterality Date  . Wisdom tooth extraction      Social History   Social History  . Marital Status: Single    Spouse Name: N/A  . Number of Children: 0  . Years of Education: N/A   Occupational History  . [employer], part time jobs   .     Social History Main Topics  . Smoking status: Former Smoker    Types: Cigarettes    Quit date: 04/08/2015  . Smokeless tobacco: Never Used     Comment: quitting, smokes sporadically  . Alcohol Use: No  . Drug Use: No     Comment: no recent   . Sexual Activity: Not on file   Other Topics Concern  . Not on file   Social History Narrative   Lives w/ room mates (off campus)        Medication List       This list is accurate as of: 04/13/15 10:11 PM.  Always use your most recent med list.             albuterol 108 (90 BASE) MCG/ACT inhaler  Commonly known as:  VENTOLIN HFA  Inhale 2 puffs into the lungs every 6 (six) hours as needed for wheezing or shortness of breath.     cyclobenzaprine 10 MG tablet  Commonly known as:  FLEXERIL  Take 1 tablet (10 mg total) by mouth at bedtime as needed for muscle spasms.     meloxicam 15 MG tablet  Commonly known as:  MOBIC  Take 1 tablet (15 mg total) by mouth daily.           Objective:   Physical Exam BP 120/72 mmHg  Pulse 80  Temp(Src) 97.7 F (36.5 C) (Oral)  Resp 16  Ht  (1.854 m)  Wt 195 lb 12.8 oz (88.814 kg)  BMI 25.84 kg/m2  SpO2 97% General:   Well developed, well nourished . NAD.  Neck:  Full range of motion, mild discomfort at the right side of the head with hyperflexion. Supple. No  thyromegaly , mass or LAD. Slightly TTP at the right trapezoid?  HEENT:  Face symmetric, atraumatic. TMs normal, throat symmetric no red,  teeth not tender to percussion. TMJ without click, no TTP  Lungs:  CTA B Normal respiratory effort, no intercostal retractions, no accessory muscle use. Heart: RRR,  no murmur.  No pretibial edema bilaterally   Neurologic:  alert & oriented X3.  Speech normal, gait appropriate for age and unassisted Strength symmetric and appropriate for age.  DTRs symmetric. EOMI  Psych: Cognition and judgment appear intact.  Cooperative with normal attention span and concentration.  Behavior appropriate. No anxious or depressed appearing.    Assessment & Plan:    headache, neck pain: New onset of headache/neck pain, I am somewhat concerned about an acute event such had intracranial  bleed starting with a neck pain.  Other consideration are a muscle skeletal pain (cervicogenic headache), migraines, TMJ versus others. Plan: CT head, if negative conservative treatment with Flexeril and meloxicam. See instructions

## 2015-04-13 NOTE — Progress Notes (Signed)
Pre visit review using our clinic review tool, if applicable. No additional management support is needed unless otherwise documented below in the visit note. 

## 2015-04-20 ENCOUNTER — Telehealth: Payer: Self-pay | Admitting: Internal Medicine

## 2015-04-20 NOTE — Telephone Encounter (Signed)
Patient was seen with a headache, currently on meloxicam and Flexeril, please call and check on him, getting better?

## 2015-04-20 NOTE — Telephone Encounter (Signed)
Spoke with Pt, he informed me headaches are much better, has been using only Ibuprofen OTC for several days. Informed him to let us know if headaches return. Pt verbalized understanding.

## 2015-11-06 ENCOUNTER — Telehealth: Payer: Self-pay | Admitting: *Deleted

## 2015-11-06 NOTE — Telephone Encounter (Signed)
Unable to reach patient at time of pre-visit call. Left message for patient to return call when available.  

## 2015-11-09 ENCOUNTER — Encounter: Payer: Self-pay | Admitting: Internal Medicine

## 2015-11-09 ENCOUNTER — Ambulatory Visit (INDEPENDENT_AMBULATORY_CARE_PROVIDER_SITE_OTHER): Payer: BLUE CROSS/BLUE SHIELD | Admitting: Internal Medicine

## 2015-11-09 VITALS — BP 106/66 | HR 62 | Temp 98.3°F | Ht 73.0 in | Wt 206.0 lb

## 2015-11-09 DIAGNOSIS — Z0000 Encounter for general adult medical examination without abnormal findings: Secondary | ICD-10-CM

## 2015-11-09 DIAGNOSIS — Z09 Encounter for follow-up examination after completed treatment for conditions other than malignant neoplasm: Secondary | ICD-10-CM | POA: Insufficient documentation

## 2015-11-09 NOTE — Assessment & Plan Note (Addendum)
Td 2014 Due for a hepatitis A vaccine and two meningococcal booster : Risks of not taking the boosters versus benefits discussed. Elected not to take any immunizations today.  Doing better with diet and exercise Labs: FLP Again discussed safe driving, safe sex , self testicular exam

## 2015-11-09 NOTE — Assessment & Plan Note (Signed)
Headache: Seen with a headache few months ago, he felt he was related to his neck. CT head negative, saw a chiropractor, feeling now better RTC one or 2 years

## 2015-11-09 NOTE — Progress Notes (Signed)
Pre visit review using our clinic review tool, if applicable. No additional management support is needed unless otherwise documented below in the visit note. 

## 2015-11-09 NOTE — Patient Instructions (Signed)
GO TO THE LAB :      Get the blood work     GO TO THE FRONT DESK  Schedule your next appointment for a  Physical exam  When?   1 or 2 years Fasting?  Yes

## 2015-11-09 NOTE — Progress Notes (Signed)
Subjective:    Patient ID: Bryan Taylor, male    DOB: November 19, 1990, 25 y.o.   MRN: [id number]  DOS:  11/09/2015 Type of visit - description : CPX Interval history: Weight gain in the last few months but for the last 2 weeks is eating healthier and is back in the gym, is already losing weight  Wt Readings from Last 3 Encounters:  11/09/15 206 lb (93.441 kg)  04/13/15 195 lb 12.8 oz (88.814 kg)  12/25/14 185 lb 8 oz (84.142 kg)     Review of Systems Constitutional: No fever. No chills. No unexplained wt changes. No unusual sweats  HEENT: No dental problems, no ear discharge, no facial swelling, no voice changes. No eye discharge, no eye  redness , no  intolerance to light   Respiratory: No wheezing , no  difficulty breathing. No cough , no mucus production  Cardiovascular: No CP, no leg swelling , no  Palpitations  GI: no nausea, no vomiting, no diarrhea , no  abdominal pain.  No blood in the stools. No dysphagia, no odynophagia    Endocrine: No polyphagia, no polyuria , no polydipsia  GU: No dysuria, gross hematuria, difficulty urinating. No urinary urgency, no frequency.  Musculoskeletal: Was seen with headaches few months ago, CT was negative, he felt it was a neck problem, since then saw a chiropractor, head and neck discomfort essentially gone. No paresthesias  Skin: No change in the color of the skin, palor , no  Rash  Allergic, immunologic: No environmental allergies , no  food allergies  Neurological: No dizziness no  syncope. No headaches. No diplopia, no slurred, no slurred speech, no motor deficits, no facial  Numbness  Hematological: No enlarged lymph nodes, no easy bruising , no unusual bleedings  Psychiatry: No suicidal ideas, no hallucinations, no beavior problems, no confusion.  No unusual/severe anxiety, no depression   Past Medical History  Diagnosis Date  . Drug abuse     Rx opioids, clean since  mid 2011  . Asthma     Past Surgical History    Procedure Laterality Date  . Wisdom tooth extraction      Social History   Social History  . Marital Status: Single    Spouse Name: N/A  . Number of Children: 0  . Years of Education: N/A   Occupational History  . graduated from [employer], working at [employer]    .     Social History Main Topics  . Smoking status: Former Smoker    Types: Cigarettes    Quit date: 04/08/2015  . Smokeless tobacco: Never Used     Comment: quit   . Alcohol Use: No  . Drug Use: No     Comment: no recent   . Sexual Activity: Not on file   Other Topics Concern  . Not on file   Social History Narrative   Lives w/ room mates       Family History  Problem Relation Age of Onset  . Diabetes Neg Hx   . Coronary artery disease Neg Hx   . Colon cancer Neg Hx   . Prostate cancer Neg Hx        Medication List       This list is accurate as of: 11/09/15  5:53 PM.  Always use your most recent med list.               albuterol 108 (90 Base) MCG/ACT inhaler  Commonly known  as:  VENTOLIN HFA  Inhale 2 puffs into the lungs every 6 (six) hours as needed for wheezing or shortness of breath.     cetirizine 10 MG tablet  Commonly known as:  ZYRTEC  Take 10 mg by mouth daily.           Objective:   Physical Exam BP 106/66 mmHg  Pulse 62  Temp(Src) 98.3 F (36.8 C) (Oral)  Ht  (1.854 m)  Wt 206 lb (93.441 kg)  BMI 27.18 kg/m2  SpO2 98%  General:   Well developed, well nourished . NAD.  Neck: No TTP, full range of motion. HEENT:  Normocephalic . Face symmetric, atraumatic Lungs:  CTA B Normal respiratory effort, no intercostal retractions, no accessory muscle use. Heart: RRR,  no murmur.  No pretibial edema bilaterally  Abdomen:  Not distended, soft, non-tender. No rebound or rigidity.   Skin: Exposed areas without rash. Not pale. Not jaundice Neurologic:  alert & oriented X3.  Speech normal, gait appropriate for age and unassisted Strength symmetric and  appropriate for age.  Psych: Cognition and judgment appear intact.  Cooperative with normal attention span and concentration.  Behavior appropriate. No anxious or depressed appearing.    Assessment & Plan:   Assessment H/o substance abuse (opiods, quit 2011) Asthma   Plan: Headache: Seen with a headache few months ago, he felt he was related to his neck. CT head negative, saw a chiropractor, feeling now better RTC one or 2 years

## 2015-11-10 LAB — LIPID PANEL
CHOL/HDL RATIO: 6
Cholesterol: 255 mg/dL — ABNORMAL HIGH (ref 0–200)
HDL: 41 mg/dL (ref >39.00)
LDL Cholesterol: 200 mg/dL — ABNORMAL HIGH (ref 0–99)
NonHDL: 213.82
TRIGLYCERIDES: 71 mg/dL (ref 0.0–149.0)
VLDL: 14.2 mg/dL (ref 0.0–40.0)

## 2016-01-20 IMAGING — CT CT HEAD W/O CM
1 series · 16 of 30 positions shown, 20 images · non-contrast
Comparison: None.

CLINICAL DATA: Right-sided headache, right jaw/facial pain x1 week

EXAM:
CT HEAD WITHOUT CONTRAST
TECHNIQUE: Contiguous axial images were obtained from the base of the skull
through the vertex without intravenous contrast.

[Series 2: head 4.8 h37s · axial · 0.46mm/px · z∈[+1022,+1159]mm · 16 of 32 slices shown, 20 images]
[im 2/32  brain]
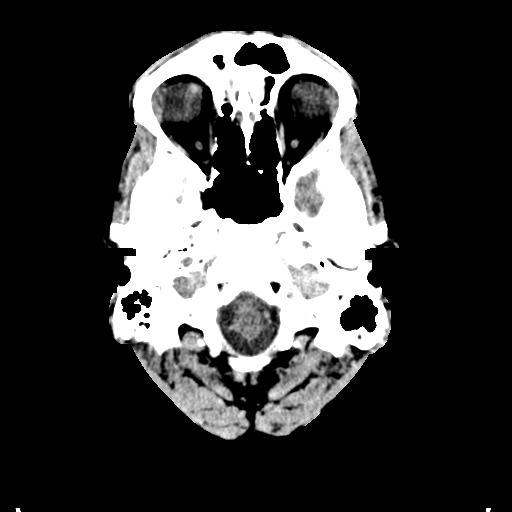
[im 2/32  bone]
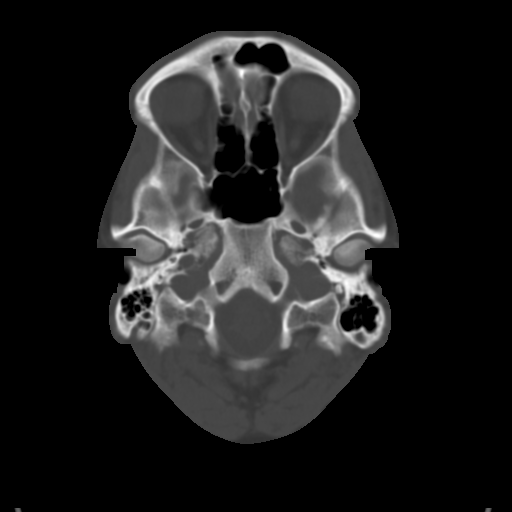
[im 4/32  brain]
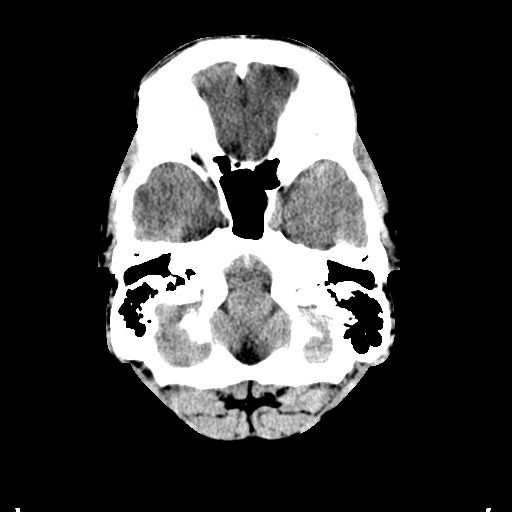
[im 6/32  brain]
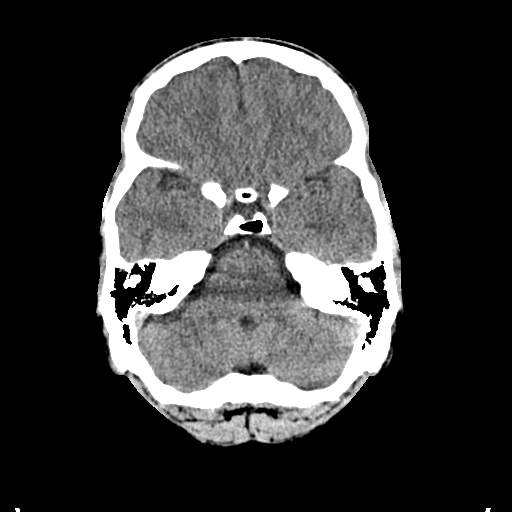
[im 8/32  brain]
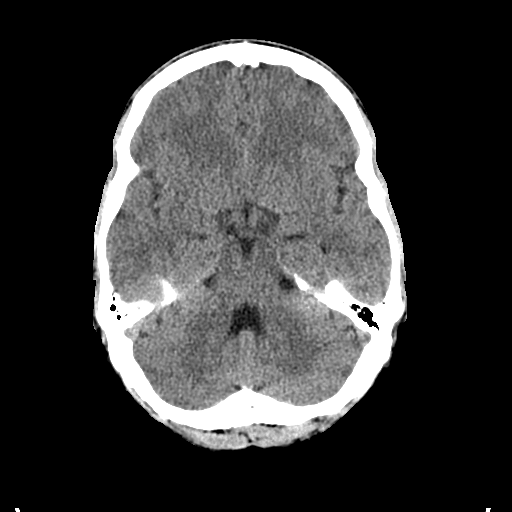
[im 9/32  brain]
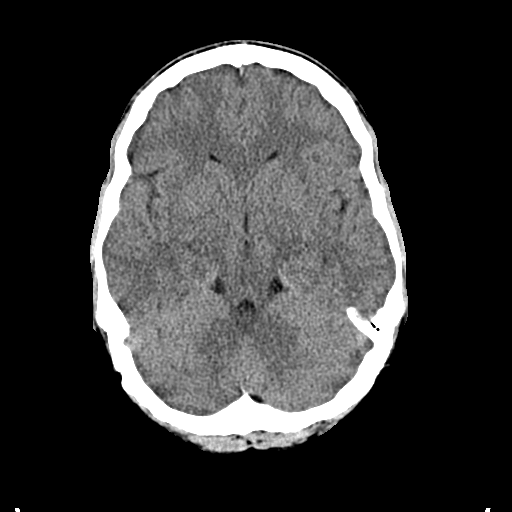
[im 9/32  bone]
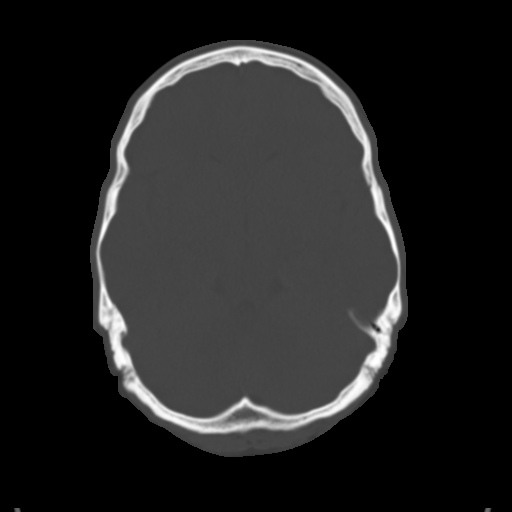
[im 11/32  brain]
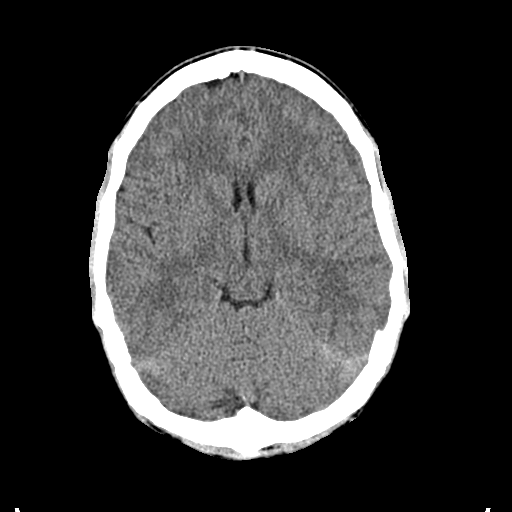
[im 13/32  brain]
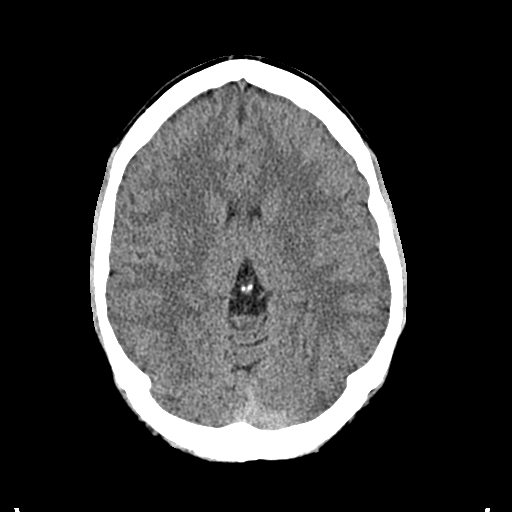
[im 15/32  brain]
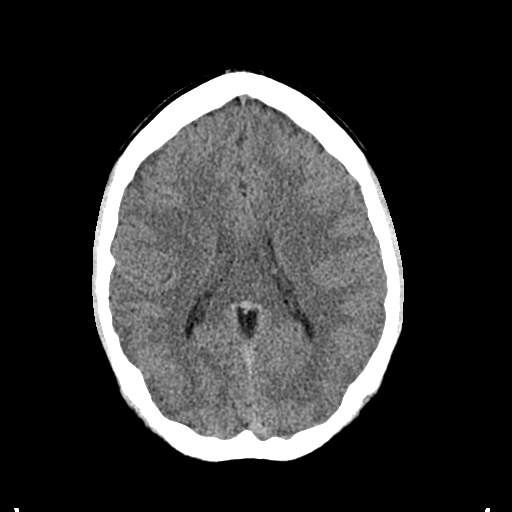
[im 17/32  brain]
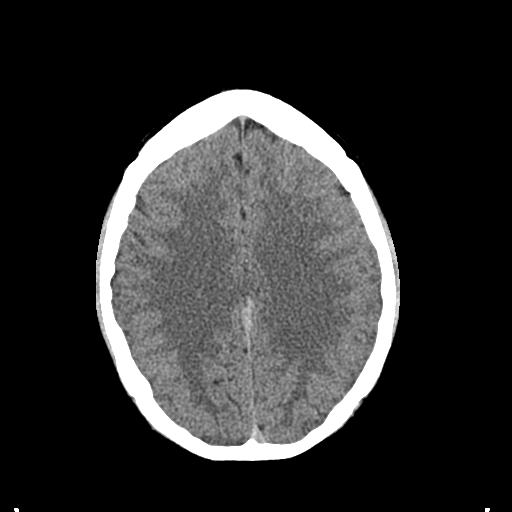
[im 17/32  bone]
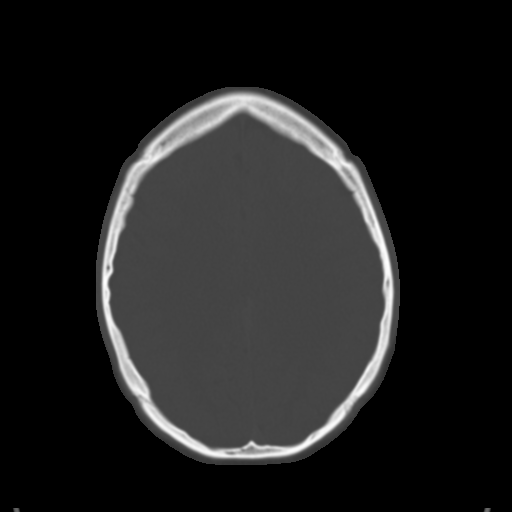
[im 19/32  brain]
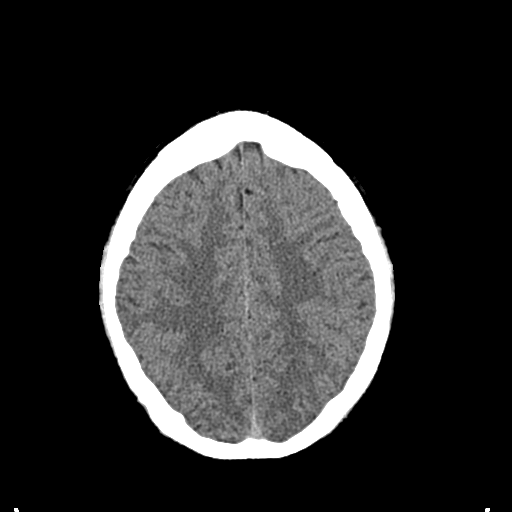
[im 21/32  brain]
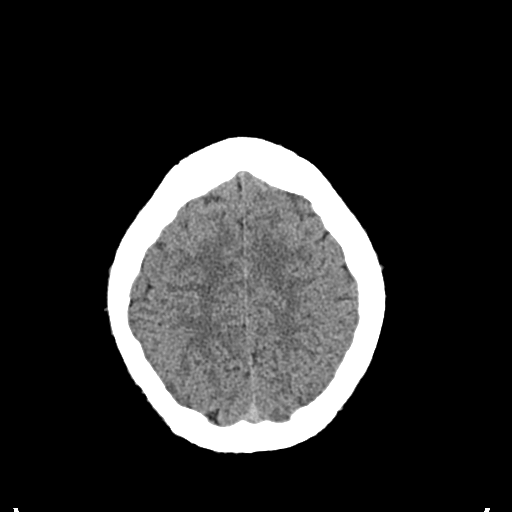
[im 23/32  brain]
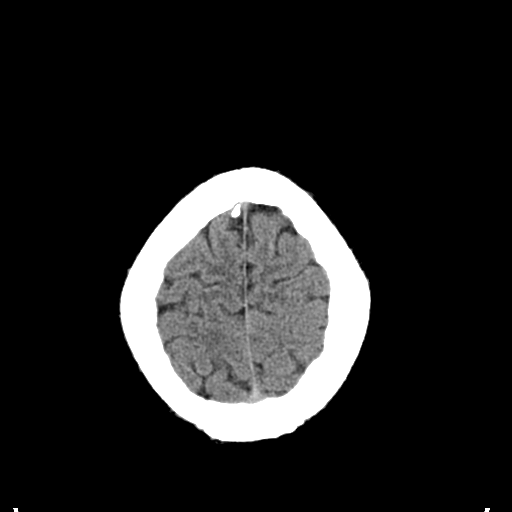
[im 24/32  brain]
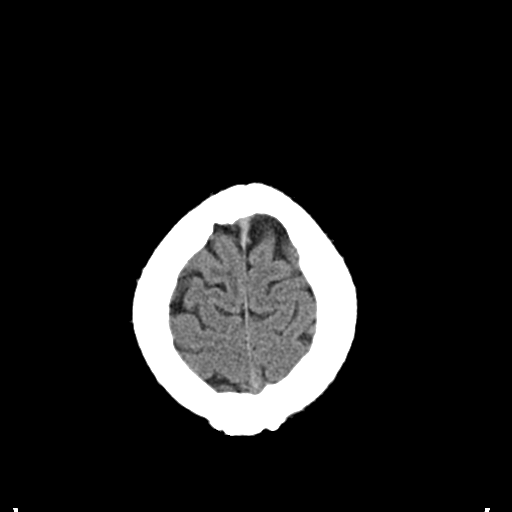
[im 24/32  bone]
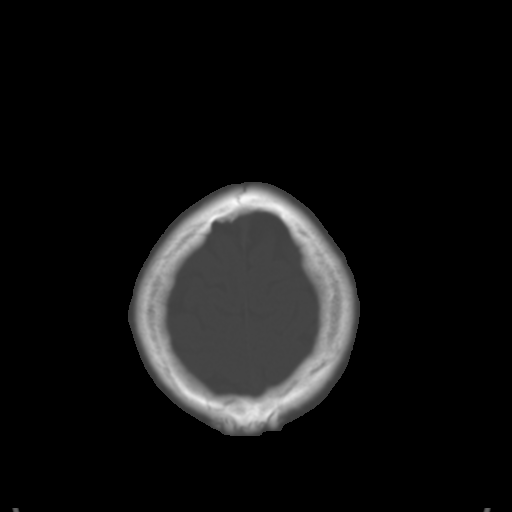
[im 26/32  brain]
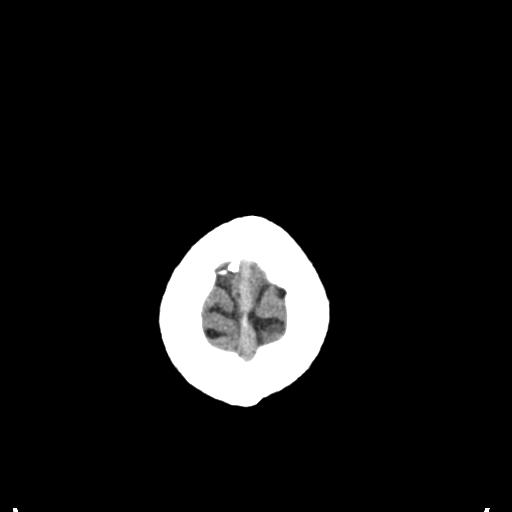
[im 28/32  brain]
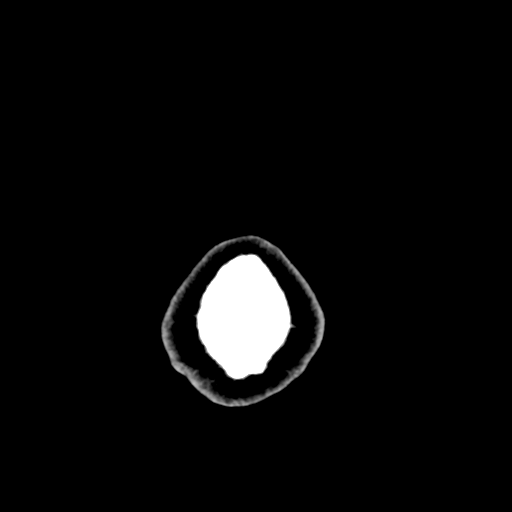
[im 30/32  brain]
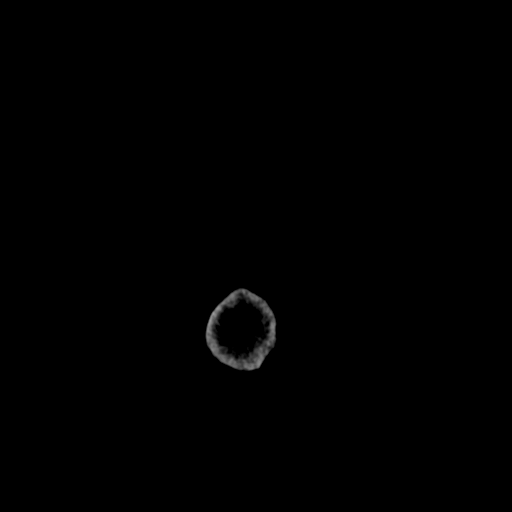

[16 of 30 positions shown; findings below may reference images not displayed]

FINDINGS: No evidence of parenchymal hemorrhage or extra-axial fluid
collection. No mass lesion, mass effect, or midline shift.

No CT evidence of acute infarction.

Cerebral volume is within normal limits.  No ventriculomegaly.

The visualized paranasal sinuses are essentially clear. The mastoid
air cells are unopacified.

The bilateral mandibular condyles are well-seated in the TMJs.

No evidence of calvarial fracture.
IMPRESSION: Normal head CT.

## 2017-03-12 ENCOUNTER — Other Ambulatory Visit: Payer: Self-pay | Admitting: Internal Medicine

## 2017-06-14 ENCOUNTER — Ambulatory Visit (INDEPENDENT_AMBULATORY_CARE_PROVIDER_SITE_OTHER): Payer: BLUE CROSS/BLUE SHIELD | Admitting: Internal Medicine

## 2017-06-14 ENCOUNTER — Encounter: Payer: Self-pay | Admitting: Internal Medicine

## 2017-06-14 VITALS — BP 116/70 | HR 89 | Temp 98.0°F | Resp 16 | Ht 71.75 in | Wt 218.2 lb

## 2017-06-14 DIAGNOSIS — Z0000 Encounter for general adult medical examination without abnormal findings: Secondary | ICD-10-CM | POA: Diagnosis not present

## 2017-06-14 DIAGNOSIS — L989 Disorder of the skin and subcutaneous tissue, unspecified: Secondary | ICD-10-CM | POA: Diagnosis not present

## 2017-06-14 MED ORDER — AZELASTINE HCL 0.1 % NA SOLN: 2.0000 | Freq: Two times a day (BID) | NASAL | 6 refills | Status: DC

## 2017-06-14 NOTE — Assessment & Plan Note (Addendum)
-  Td 2014, strongly declined flu shot  Aged out of   hepatitis A and  meningococcal booster -Has gained some weight, he is going to the gym regularly, thinks is mostly muscle related. : -Labs: Will come back fasting for a CMP, FLP, CBC, TSH - discussed  safe sex , self testicular exam

## 2017-06-14 NOTE — Patient Instructions (Addendum)
GO TO THE FRONT DESK Come back fasting for blood work within the next week  Schedule your next appointment for a  physical exam in one year   Continue Zyrtec  Flonase OTC  2 sprays on each side of the nose every morning  Astelin: 2 sprays in each side of the nose twice a day     Testicular Self-Exam A self-examination of your testicles (testicular self-exam) involves looking at and feeling your testicles for abnormal lumps or swelling. Several things can cause swelling, lumps, or pain in your testicles. Some of these causes are:  Injuries.  Inflammation.  Infection.  Buildup of fluids around your testicle (hydrocele).  Twisted testicles (testicular torsion).  Testicular cancer.  Why is it important to do a testicular self-exam? Self-examination of the testicles and the left and right groin areas may be recommended if you are at risk for testicular cancer. Your groin is where your lower abdomen meets your upper thighs. You may be at risk for testicular cancer if you have:  An undescended testicle (cryptorchidism).  A history of previous testicular cancer.  A family history of testicular cancer.  How to do a testicular self-exam The testicles are easiest to examine after a warm bath or shower. They are more difficult to examine when you are cold. This is because the muscles attached to the testicles retract and pull them up higher or into the abdomen. A normal testicle is egg-shaped and feels firm. It is smooth and not tender. The spermatic cord can be felt as a firm, spaghetti-like cord at the back of your testicle. Look and feel for changes  Stand and hold your penis away from your body.  Look at each testicle to check for lumps or swelling.  Roll each testicle between your thumb and forefinger, feeling the entire testicle. Feel for: ? Lumps. ? Swelling. ? Discomfort.  Check the groin area between your abdomen and upper thighs on both sides of your body. Look and  feel for any swelling or bumps that are tender. These could be enlarged lymph nodes. Contact a health care provider if:  You find any bumps or lumps, such as a small, hard, pea-sized lump.  You find swelling, pain, or soreness.  You see or feel any other changes in your testicles. Summary  A self-examination of your testicles (testicular self-exam) involves looking at and feeling your testicles for any changes.  Self-examination of the testicles and the left and right groin areas may be recommended if you are at risk for testicular cancer.  You should check each of your testicles for lumps, swelling, or discomfort.  You should check for swelling or tender bumps in your groin area between your lower abdomen and upper thighs. This information is not intended to replace advice given to you by your health care provider. Make sure you discuss any questions you have with your health care provider. Document Released: 11/14/2000 Document Revised: 07/04/2016 Document Reviewed: 07/04/2016 Elsevier Interactive Patient Education  2017 [employer].    Safe Sex Practicing safe sex means taking steps before and during sex to reduce your risk of:  Getting an STD (sexually transmitted disease).  Giving your partner an STD.  Unwanted pregnancy.  How can I practice safe sex?  To practice safe sex:  Limit your sexual partners to only one partner who is having sex with only you.  Avoid using alcohol and recreational drugs before having sex. These substances can affect your judgment.  Before having sex with  a new partner: ? Talk to your partner about past partners, past STDs, and drug use. ? You and your partner should be screened for STDs and discuss the results with each other.  Check your body regularly for sores, blisters, rashes, or unusual discharge. If you notice any of these problems, visit your health care provider.  If you have symptoms of an infection or you are being treated for  an STD, avoid sexual contact.  While having sex, use a condom. Make sure to: ? Use a condom every time you have vaginal, oral, or anal sex. Both females and males should wear condoms during oral sex. ? Keep condoms in place from the beginning to the end of sexual activity. ? Use a latex condom, if possible. Latex condoms offer the best protection. ? Use only water-based lubricants or oils to lubricate a condom. Using petroleum-based lubricants or oils will weaken the condom and increase the chance that it will break.  See your health care provider for regular screenings, exams, and tests for STDs.  Talk with your health care provider about the form of birth control (contraception) that is best for you.  Get vaccinated against hepatitis B and human papillomavirus (HPV).  If you are at risk of being infected with HIV (human immunodeficiency virus), talk with your health care provider about taking a prescription medicine to prevent HIV infection. You are considered at risk for HIV if: ? You are a man who has sex with other men. ? You are a heterosexual man or woman who is sexually active with more than one partner. ? You take drugs by injection. ? You are sexually active with a partner who has HIV.  This information is not intended to replace advice given to you by your health care provider. Make sure you discuss any questions you have with your health care provider. Document Released: 09/15/2004 Document Revised: 12/23/2015 Document Reviewed: 06/28/2015 Elsevier Interactive Patient Education  [employer]2018 Elsevier Inc.

## 2017-06-14 NOTE — Progress Notes (Signed)
Subjective:    Patient ID: Bryan Taylor, male    DOB: Jan 19, 1991, 26 y.o.   MRN: [id number]  DOS:  06/14/2017 Type of visit - description : cpx Interval history: His father was DX with melanoma, patient has at mole @ the abdomen, does not know for how long. Since the fall started, he is having nasal pressure, clear nasal discharge, right-sided frontal headache. On Zyrtec and Sudafed with only partial relief of symptoms   Wt Readings from Last 3 Encounters:  06/14/17 218 lb 4 oz (99 kg)  11/09/15 206 lb (93.4 kg)  04/13/15 195 lb 12.8 oz (88.8 kg)     Review of Systems No fever chills [place] No sorethroat  Other than above, a 14 point review of systems is negative      Past Medical History:  Diagnosis Date  . Asthma   . Drug abuse (HCC)    Rx opioids, clean since  mid 2011    Past Surgical History:  Procedure Laterality Date  . WISDOM TOOTH EXTRACTION      Social History   Social History  . Marital status: Single    Spouse name: N/A  . Number of children: 0  . Years of education: N/A   Occupational History  . graduated from [employer], working at [employer] store    .  [employer]   Social History Main Topics  . Smoking status: Former Smoker    Types: Cigarettes    Quit date: 04/08/2015  . Smokeless tobacco: Never Used     Comment: quit   . Alcohol use No  . Drug use: No     Comment: no recent   . Sexual activity: Not on file   Other Topics Concern  . Not on file   Social History Narrative   Lives w/ room mates       Family History  Problem Relation Age of Onset  . Melanoma Father   . Diabetes Neg Hx   . Coronary artery disease Neg Hx   . Colon cancer Neg Hx   . Prostate cancer Neg Hx      Allergies as of 06/14/2017   No Known Allergies     Medication List       Accurate as of 06/14/17 11:59 PM. Always use your most recent med list.          albuterol 108 (90 Base) MCG/ACT inhaler Commonly known as:  VENTOLIN HFA Inhale  2 puffs into the lungs every 6 (six) hours as needed for wheezing or shortness of breath.   azelastine 0.1 % nasal spray Commonly known as:  ASTELIN Place 2 sprays into both nostrils 2 (two) times daily.   cetirizine 10 MG tablet Commonly known as:  ZYRTEC Take 10 mg by mouth daily.   pseudoephedrine 30 MG tablet Commonly known as:  SUDAFED Take 30 mg by mouth every 4 (four) hours as needed for congestion.          Objective:   Physical Exam  Skin:      BP 116/70 (BP Location: Left Arm, Patient Position: Sitting, Cuff Size: Normal)   Pulse 89   Temp 98 F (36.7 C) (Oral)   Resp 16   Ht 5' 11.75" (1.822 m)   Wt 218 lb 4 oz (99 kg)   SpO2 96%   BMI 29.81 kg/m    General:   Well developed, well nourished . NAD.  Neck: No  thyromegaly  HEENT:  Normocephalic .  Face symmetric, atraumatic Lungs:  CTA B Normal respiratory effort, no intercostal retractions, no accessory muscle use. Heart: RRR,  no murmur.  No pretibial edema bilaterally  Abdomen:  Not distended, soft, non-tender. No rebound or rigidity.   Skin: Exposed areas without rash. Not pale. Not jaundice Neurologic:  alert & oriented X3.  Speech normal, gait appropriate for age and unassisted Strength symmetric and appropriate for age.  Psych: Cognition and judgment appear intact.  Cooperative with normal attention span and concentration.  Behavior appropriate. No anxious or depressed appearing.    Assessment & Plan:   Assessment H/o substance abuse (opiods, quit 2011) Asthma  Allergies  Plan:  H/o abuse: He remains abstinent. Asthma: No sxs, has albuterol just in case Allergies: Seem to be seasonal, sxs started 2 weeks ago, recommend to continue Zyrtec. Add Flonase, Astelin. Call if no better. FH melanoma, has a irregular shaped mole. Refer to dermatology RTC one year

## 2017-06-15 NOTE — Assessment & Plan Note (Signed)
H/o abuse: He remains abstinent. Asthma: No sxs, has albuterol just in case Allergies: Seem to be seasonal, sxs started 2 weeks ago, recommend to continue Zyrtec. Add Flonase, Astelin. Call if no better. FH melanoma, has a irregular shaped mole. Refer to dermatology RTC one year

## 2017-06-19 ENCOUNTER — Other Ambulatory Visit (INDEPENDENT_AMBULATORY_CARE_PROVIDER_SITE_OTHER): Payer: BLUE CROSS/BLUE SHIELD

## 2017-06-19 DIAGNOSIS — Z0000 Encounter for general adult medical examination without abnormal findings: Secondary | ICD-10-CM

## 2017-06-19 LAB — CBC
HEMATOCRIT: 45.7 % (ref 39.0–52.0)
HEMOGLOBIN: 15.1 g/dL (ref 13.0–17.0)
MCHC: 33.2 g/dL (ref 30.0–36.0)
MCV: 89.3 fl (ref 78.0–100.0)
Platelets: 229 10*3/uL (ref 150.0–400.0)
RBC: 5.12 Mil/uL (ref 4.22–5.81)
RDW: 13.2 % (ref 11.5–15.5)
WBC: 6.8 10*3/uL (ref 4.0–10.5)

## 2017-06-19 LAB — COMPREHENSIVE METABOLIC PANEL
ALK PHOS: 44 U/L (ref 39–117)
ALT: 14 U/L (ref 0–53)
AST: 18 U/L (ref 0–37)
Albumin: 4.4 g/dL (ref 3.5–5.2)
BUN: 12 mg/dL (ref 6–23)
CALCIUM: 9.9 mg/dL (ref 8.4–10.5)
CHLORIDE: 104 meq/L (ref 96–112)
CO2: 29 meq/L (ref 19–32)
Creatinine, Ser: 0.85 mg/dL (ref 0.40–1.50)
GFR: 114.63 mL/min (ref >60.00)
Glucose, Bld: 88 mg/dL (ref 70–99)
POTASSIUM: 3.7 meq/L (ref 3.5–5.1)
Sodium: 141 mEq/L (ref 135–145)
Total Bilirubin: 0.5 mg/dL (ref 0.2–1.2)
Total Protein: 7.1 g/dL (ref 6.0–8.3)

## 2017-06-19 LAB — LIPID PANEL
CHOLESTEROL: 178 mg/dL (ref 0–200)
HDL: 37.9 mg/dL — AB (ref >39.00)
LDL Cholesterol: 112 mg/dL — ABNORMAL HIGH (ref 0–99)
NonHDL: 139.65
TRIGLYCERIDES: 138 mg/dL (ref 0.0–149.0)
Total CHOL/HDL Ratio: 5
VLDL: 27.6 mg/dL (ref 0.0–40.0)

## 2017-06-19 LAB — TSH: TSH: 1.99 u[IU]/mL (ref 0.35–4.50)

## 2018-06-18 ENCOUNTER — Ambulatory Visit (INDEPENDENT_AMBULATORY_CARE_PROVIDER_SITE_OTHER): Payer: 59 | Admitting: Internal Medicine

## 2018-06-18 ENCOUNTER — Encounter: Payer: Self-pay | Admitting: Internal Medicine

## 2018-06-18 VITALS — BP 128/84 | HR 90 | Temp 98.0°F | Resp 16 | Ht 72.0 in | Wt 232.0 lb

## 2018-06-18 DIAGNOSIS — F1910 Other psychoactive substance abuse, uncomplicated: Secondary | ICD-10-CM

## 2018-06-18 DIAGNOSIS — Z0000 Encounter for general adult medical examination without abnormal findings: Secondary | ICD-10-CM | POA: Diagnosis not present

## 2018-06-18 MED ORDER — BECLOMETHASONE DIPROP HFA 80 MCG/ACT IN AERB: 2.0000 | INHALATION_SPRAY | Freq: Every day | RESPIRATORY_TRACT | 6 refills | Status: DC

## 2018-06-18 NOTE — Progress Notes (Signed)
Subjective:    Patient ID: Bryan Taylor, male    DOB: 07-Nov-1990, 27 y.o.   MRN: [id number]  DOS:  06/18/2018 Type of visit - description : cpx Interval history: Here for a physical exam, we also addressed other issues but in general he is feeling well.  Wt Readings from Last 3 Encounters:  06/18/18 232 lb (105.2 kg)  06/14/17 218 lb 4 oz (99 kg)  11/09/15 206 lb (93.4 kg)     Review of Systems  A 14 point review of systems is negative    Past Medical History:  Diagnosis Date  . Asthma   . Drug abuse (HCC)    Rx opioids, clean since  mid 2011    Past Surgical History:  Procedure Laterality Date  . WISDOM TOOTH EXTRACTION      Social History   Socioeconomic History  . Marital status: Single    Spouse name: Not on file  . Number of children: 0  . Years of education: Not on file  . Highest education level: Not on file  Occupational History  . Occupation: graduated from [employer], working at a Insurance underwriter: [employer]  Social Needs  . Financial resource strain: Not on file  . Food insecurity:    Worry: Not on file    Inability: Not on file  . Transportation needs:    Medical: Not on file    Non-medical: Not on file  Tobacco Use  . Smoking status: Former Smoker    Types: Cigarettes    Last attempt to quit: 04/08/2015    Years since quitting: 3.1  . Smokeless tobacco: Never Used  . Tobacco comment: quit   Substance and Sexual Activity  . Alcohol use: No    Alcohol/week: 0.0 standard drinks  . Drug use: No    Comment: no recent   . Sexual activity: Yes  Lifestyle  . Physical activity:    Days per week: Not on file    Minutes per session: Not on file  . Stress: Not on file  Relationships  . Social connections:    Talks on phone: Not on file    Gets together: Not on file    Attends religious service: Not on file    Active member of club or organization: Not on file    Attends meetings of clubs or organizations: Not on file   Relationship status: Not on file  . Intimate partner violence:    Fear of current or ex partner: Not on file    Emotionally abused: Not on file    Physically abused: Not on file    Forced sexual activity: Not on file  Other Topics Concern  . Not on file  Social History Narrative   Lives w/ girlfriends   Bought a house 2019     Family History  Problem Relation Age of Onset  . Melanoma Father   . Valvular heart disease Father        Ao valve dz  . Diabetes Neg Hx   . Coronary artery disease Neg Hx   . Colon cancer Neg Hx   . Prostate cancer Neg Hx      Allergies as of 06/18/2018   No Known Allergies     Medication List        Accurate as of 06/18/18  8:51 PM. Always use your most recent med list.          albuterol 108 (90 Base)  MCG/ACT inhaler Commonly known as:  PROVENTIL HFA;VENTOLIN HFA Inhale 2 puffs into the lungs every 6 (six) hours as needed for wheezing or shortness of breath.   azelastine 0.1 % nasal spray Commonly known as:  ASTELIN Place 2 sprays into both nostrils 2 (two) times daily.   beclomethasone 80 MCG/ACT inhaler Commonly known as:  QVAR Inhale 2 puffs into the lungs daily.   cetirizine 10 MG tablet Commonly known as:  ZYRTEC Take 10 mg by mouth daily.          Objective:   Physical Exam BP 128/84 (BP Location: Left Arm, Patient Position: Sitting, Cuff Size: Normal)   Pulse 90   Temp 98 F (36.7 C) (Oral)   Resp 16   Ht 6' (1.829 m)   Wt 232 lb (105.2 kg)   SpO2 98%   BMI 31.46 kg/m  General: Well developed, NAD, see BMI.  Neck: No  thyromegaly  HEENT:  Normocephalic . Face symmetric, atraumatic Lungs:  CTA B Normal respiratory effort, no intercostal retractions, no accessory muscle use. Heart: RRR,  no murmur.  No pretibial edema bilaterally  Abdomen:  Not distended, soft, non-tender. No rebound or rigidity.   Skin: Exposed areas without rash. Not pale. Not jaundice Neurologic:  alert & oriented X3.  Speech normal,  gait appropriate for age and unassisted Strength symmetric and appropriate for age.  Psych: Cognition and judgment appear intact.  Cooperative with normal attention span and concentration.  Behavior appropriate. No anxious or depressed appearing.     Assessment & Plan:   Assessment H/o substance abuse (opiods, quit 2011) Asthma  Allergies  Plan: Asthma: Uses albuterol 4-5 times a week, wonders about daily medication, I recommend Qvar 2 puffs daily.  Still okay to use albuterol, goal is to decrease how many times he uses albuterol. History of substance abuse: Doing well, no relapse, denies depression, anxiety, sees a counselor regularly. FH melanoma: Saw dermatology, status post 1 biopsy, no cancer,  he will be checked every 2 years. RTC 1 year

## 2018-06-18 NOTE — Progress Notes (Signed)
Pre visit review using our clinic review tool, if applicable. No additional management support is needed unless otherwise documented below in the visit note. 

## 2018-06-18 NOTE — Patient Instructions (Signed)
GO TO THE LAB : Get the blood work     GO TO THE FRONT DESK Schedule your next appointment for a  Physical exam in 1 year   Qvar 2 puffs every day Keep your albuterol for as needed use ("rescue inhaler")

## 2018-06-18 NOTE — Assessment & Plan Note (Addendum)
-  Td 2014,  declined flu shot, benefits discussed particularly w/ h/o asthma  -Labs: FLP, A1c, HIV, hep C, RPR -Patient education: Has gained a significant amount of weight.  Diet, exercise discussed. -He is now in a stable relationship w/ a male, she had a + HIV test that turned to be false positive.

## 2018-06-18 NOTE — Assessment & Plan Note (Signed)
Asthma: Uses albuterol 4-5 times a week, wonders about daily medication, I recommend Qvar 2 puffs daily.  Still okay to use albuterol, goal is to decrease how many times he uses albuterol. History of substance abuse: Doing well, no relapse, denies depression, anxiety, sees a counselor regularly. FH melanoma: Saw dermatology, status post 1 biopsy, no cancer,  he will be checked every 2 years. RTC 1 year

## 2018-06-19 LAB — LIPID PANEL
CHOLESTEROL: 231 mg/dL — AB (ref 0–200)
HDL: 45.6 mg/dL (ref >39.00)
LDL CALC: 157 mg/dL — AB (ref 0–99)
NonHDL: 185.37
TRIGLYCERIDES: 140 mg/dL (ref 0.0–149.0)
Total CHOL/HDL Ratio: 5
VLDL: 28 mg/dL (ref 0.0–40.0)

## 2018-06-19 LAB — HEMOGLOBIN A1C: HEMOGLOBIN A1C: 5.4 % (ref 4.6–6.5)

## 2018-06-19 LAB — HIV ANTIBODY (ROUTINE TESTING W REFLEX): HIV 1&2 Ab, 4th Generation: NONREACTIVE

## 2018-06-19 LAB — RPR: RPR: NONREACTIVE

## 2018-06-19 LAB — HEPATITIS C ANTIBODY
Hepatitis C Ab: NONREACTIVE
SIGNAL TO CUT-OFF: 0.01 (ref <1.00)

## 2018-07-11 ENCOUNTER — Telehealth: Payer: Self-pay | Admitting: Internal Medicine

## 2018-07-11 MED ORDER — FLUTICASONE PROPIONATE HFA 220 MCG/ACT IN AERO: 2.0000 | INHALATION_SPRAY | Freq: Every day | RESPIRATORY_TRACT | 12 refills | Status: DC

## 2018-07-11 NOTE — Telephone Encounter (Signed)
Recommend to call his insurance and see what medication is covered, options are: Pulmicort Flovent Asmanex He is to let me know .  Also check w/  [facility] : ? generic Qvar.

## 2018-07-11 NOTE — Telephone Encounter (Signed)
Ok flovent sent

## 2018-07-11 NOTE — Telephone Encounter (Signed)
Please advise- we have not received any information from pharmacy regarding cost.

## 2018-07-11 NOTE — Telephone Encounter (Signed)
Spoke w/ Pt- informed that Flovent has been sent to pharmacy. Instructed to let us know if he has any problem w/ medication.

## 2018-07-11 NOTE — Telephone Encounter (Signed)
Pulmicort, and Asmanex not on formulary. Flovent is tier 1.

## 2018-07-11 NOTE — Telephone Encounter (Signed)
Copied from CRM [id number]. Topic: Quick Communication - See Telephone Encounter >> Jul 11, 2018  1:54 PM Herby AbrahamJohnson, Shiquita C wrote: CRM for notification. See Telephone encounter for: 07/11/18.  Pt called in to be advised. Pt says that provider prescribed beclomethasone (QVAR REDIHALER) 80 MCG/ACT inhaler but it is to expensive. Pt says that pharmacy advised him that they have communicated this to the provider and has not received a response.pt would like to know if PCP could suggest a less expensive medication?   Pharmacy: [id number] [place], [place] - [address] [phone] (Phone) [phone] (Fax)

## 2018-07-23 ENCOUNTER — Encounter: Payer: Self-pay | Admitting: Internal Medicine

## 2018-07-23 ENCOUNTER — Telehealth: Payer: Self-pay

## 2018-07-23 NOTE — Telephone Encounter (Signed)
Copied from CRM [id number]. Topic: General - Inquiry >> Jul 23, 2018 11:56 AM Windy KalataMichael, Taylor L, NT wrote: Reason for CRM: patient is calling and states he is trying to get money back with his insurance for getting his physical this year. He had his physical on 06/18/18 and he is needing a note stating that he had that done. He would like it emailed or mailed. Chart is updated with current information.

## 2018-07-26 NOTE — Telephone Encounter (Signed)
Letter mailed to Pt.

## 2018-08-17 ENCOUNTER — Other Ambulatory Visit: Payer: Self-pay | Admitting: Internal Medicine

## 2018-08-17 MED ORDER — ALBUTEROL SULFATE HFA 108 (90 BASE) MCG/ACT IN AERS: 2.0000 | INHALATION_SPRAY | Freq: Four times a day (QID) | RESPIRATORY_TRACT | 5 refills | Status: DC | PRN

## 2018-08-17 NOTE — Telephone Encounter (Signed)
Copied from CRM [id number]. Topic: Quick Communication - Rx Refill/Question >> Aug 17, 2018 12:28 PM Bryan Taylor, Bryan Taylor wrote: Medication: albuterol (VENTOLIN HFA) 108 (90 Base) MCG/ACT inhaler  Has the patient contacted their pharmacy? No.  Preferred Pharmacy (with phone number or street name): [name], [name] - [address] [phone] (Phone) [phone] (Fax)    Agent: Please be advised that RX refills may take up to 3 business days. We ask that you follow-up with your pharmacy.

## 2019-03-12 ENCOUNTER — Other Ambulatory Visit: Payer: Self-pay

## 2019-03-12 DIAGNOSIS — Z20822 Contact with and (suspected) exposure to covid-19: Secondary | ICD-10-CM

## 2019-03-14 LAB — NOVEL CORONAVIRUS, NAA: SARS-CoV-2, NAA: NOT DETECTED

## 2019-03-19 ENCOUNTER — Encounter: Payer: Self-pay | Admitting: Internal Medicine

## 2019-05-16 ENCOUNTER — Telehealth: Payer: Self-pay | Admitting: Internal Medicine

## 2019-05-16 NOTE — Telephone Encounter (Signed)
Note from the patient's pharmacy is reviewed, they are recommending to start a daily asthma medication, apparently he is using his rescue inhaler frequently. Please arrange a office visit.

## 2019-05-17 NOTE — Telephone Encounter (Signed)
SWP and he stated his insurance has changed and we are out of network now and he can no longer see [facility].  He is now seeing another PCP.  I will remove you as pt's PCP in Epic.

## 2019-05-17 NOTE — Telephone Encounter (Signed)
Noted, thank you

## 2019-06-24 ENCOUNTER — Encounter: Payer: 59 | Admitting: Internal Medicine

## 2020-09-24 DIAGNOSIS — F419 Anxiety disorder, unspecified: Secondary | ICD-10-CM | POA: Insufficient documentation

## 2020-12-29 DIAGNOSIS — A6000 Herpesviral infection of urogenital system, unspecified: Secondary | ICD-10-CM | POA: Insufficient documentation

## 2021-06-08 DIAGNOSIS — M26629 Arthralgia of temporomandibular joint, unspecified side: Secondary | ICD-10-CM | POA: Insufficient documentation

## 2023-04-10 DIAGNOSIS — Z86018 Personal history of other benign neoplasm: Secondary | ICD-10-CM | POA: Diagnosis not present

## 2023-04-10 DIAGNOSIS — L731 Pseudofolliculitis barbae: Secondary | ICD-10-CM | POA: Diagnosis not present

## 2023-04-10 DIAGNOSIS — D1801 Hemangioma of skin and subcutaneous tissue: Secondary | ICD-10-CM | POA: Diagnosis not present

## 2023-04-10 DIAGNOSIS — D229 Melanocytic nevi, unspecified: Secondary | ICD-10-CM | POA: Diagnosis not present

## 2023-04-10 DIAGNOSIS — L814 Other melanin hyperpigmentation: Secondary | ICD-10-CM | POA: Diagnosis not present

## 2023-04-10 DIAGNOSIS — L821 Other seborrheic keratosis: Secondary | ICD-10-CM | POA: Diagnosis not present

## 2023-04-10 DIAGNOSIS — L578 Other skin changes due to chronic exposure to nonionizing radiation: Secondary | ICD-10-CM | POA: Diagnosis not present

## 2023-04-19 ENCOUNTER — Other Ambulatory Visit (HOSPITAL_BASED_OUTPATIENT_CLINIC_OR_DEPARTMENT_OTHER): Payer: Self-pay

## 2023-04-19 MED ORDER — EZETIMIBE 10 MG PO TABS
10.0000 mg | ORAL_TABLET | Freq: Every day | ORAL | 1 refills | Status: DC
  Filled 2023-04-20: qty 90, 90d supply, fill #0

## 2023-04-20 ENCOUNTER — Other Ambulatory Visit (HOSPITAL_BASED_OUTPATIENT_CLINIC_OR_DEPARTMENT_OTHER): Payer: Self-pay

## 2023-04-25 ENCOUNTER — Other Ambulatory Visit (HOSPITAL_BASED_OUTPATIENT_CLINIC_OR_DEPARTMENT_OTHER): Payer: Self-pay

## 2023-05-04 DIAGNOSIS — F901 Attention-deficit hyperactivity disorder, predominantly hyperactive type: Secondary | ICD-10-CM | POA: Diagnosis not present

## 2023-05-04 DIAGNOSIS — F411 Generalized anxiety disorder: Secondary | ICD-10-CM | POA: Diagnosis not present

## 2023-05-16 ENCOUNTER — Telehealth: Payer: Self-pay | Admitting: Internal Medicine

## 2023-05-16 NOTE — Telephone Encounter (Signed)
Pt called to re-establish care with Dr. Drue Novel. Pt stated that he was having insurance issues at the time of him having to stop seeing him but he is on insurance that he is in network with. Please Advise.

## 2023-05-29 NOTE — Telephone Encounter (Signed)
Is okay, thank you 

## 2023-06-02 NOTE — Telephone Encounter (Signed)
Pt was called and scheduled !

## 2023-07-31 ENCOUNTER — Other Ambulatory Visit: Payer: Self-pay

## 2023-07-31 ENCOUNTER — Ambulatory Visit (INDEPENDENT_AMBULATORY_CARE_PROVIDER_SITE_OTHER): Payer: 59 | Admitting: Internal Medicine

## 2023-07-31 ENCOUNTER — Other Ambulatory Visit (HOSPITAL_BASED_OUTPATIENT_CLINIC_OR_DEPARTMENT_OTHER): Payer: Self-pay

## 2023-07-31 ENCOUNTER — Encounter: Payer: Self-pay | Admitting: Internal Medicine

## 2023-07-31 VITALS — BP 126/80 | HR 80 | Temp 97.9°F | Resp 16 | Ht 72.0 in | Wt 263.2 lb

## 2023-07-31 DIAGNOSIS — F419 Anxiety disorder, unspecified: Secondary | ICD-10-CM | POA: Diagnosis not present

## 2023-07-31 DIAGNOSIS — E785 Hyperlipidemia, unspecified: Secondary | ICD-10-CM | POA: Insufficient documentation

## 2023-07-31 DIAGNOSIS — J3089 Other allergic rhinitis: Secondary | ICD-10-CM | POA: Insufficient documentation

## 2023-07-31 DIAGNOSIS — J4520 Mild intermittent asthma, uncomplicated: Secondary | ICD-10-CM | POA: Diagnosis not present

## 2023-07-31 MED ORDER — ALBUTEROL SULFATE HFA 108 (90 BASE) MCG/ACT IN AERS
2.0000 | INHALATION_SPRAY | Freq: Four times a day (QID) | RESPIRATORY_TRACT | 5 refills | Status: AC | PRN
  Filled 2023-07-31: qty 6.7, 28d supply, fill #0
  Filled 2023-12-04: qty 6.7, 28d supply, fill #1

## 2023-07-31 MED ORDER — EZETIMIBE 10 MG PO TABS
10.0000 mg | ORAL_TABLET | Freq: Every day | ORAL | 1 refills | Status: DC
  Filled 2023-07-31: qty 90, 90d supply, fill #0
  Filled 2023-12-04: qty 90, 90d supply, fill #1

## 2023-07-31 MED ORDER — NALTREXONE HCL (PAIN) 4.5 MG PO CAPS: ORAL_CAPSULE | ORAL | Status: AC

## 2023-07-31 MED ORDER — AZELASTINE HCL 0.1 % NA SOLN
2.0000 | Freq: Two times a day (BID) | NASAL | 6 refills | Status: DC
  Filled 2023-07-31 (×2): qty 30, 50d supply, fill #0

## 2023-07-31 NOTE — Progress Notes (Signed)
Subjective:    Patient ID: Bryan Taylor, male    DOB: [birth year], 32 y.o.   MRN: [id number]  DOS:  07/31/2023 Type of visit - description: New patient, to reestablish  Since the last office visit in 2019 was follow-up by another primary care provider. Currently feels well. He is concerned about his weight, it goes up and down throughout the year.    Wt Readings from Last 3 Encounters:  07/31/23 263 lb 4 oz (119.4 kg)  06/18/18 232 lb (105.2 kg)  06/14/17 218 lb 4 oz (99 kg)     Review of Systems See above   Past Medical History:  Diagnosis Date   Anxiety    Asthma    Drug abuse (HCC)    Rx opioids, clean since  mid 2011   Erectile dysfunction    Hyperlipidemia    Scoliosis    Vitamin D deficiency     Past Surgical History:  Procedure Laterality Date   WISDOM TOOTH EXTRACTION     Social Connections: Unknown (04/06/2022)   Received from [facility] visits prior to 10/22/2022., [facility]   Social Connection and Isolation Panel [NHANES]    Frequency of Communication with Friends and Family: More than three times a week    Frequency of Social Gatherings with Friends and Family: Three times a week    Attends Religious Services: Patient declined    Active Member of Clubs or Organizations: Yes    Attends Engineer, structural: More than 4 times per year    Marital Status: Living with partner    Current Outpatient Medications  Medication Instructions   albuterol (VENTOLIN HFA) 108 (90 Base) MCG/ACT inhaler 2 puffs, Inhalation, Every 6 hours PRN   azelastine (ASTELIN) 0.1 % nasal spray 2 sprays, Each Nare, 2 times daily   cetirizine (ZYRTEC) 10 mg, Oral, Daily   ezetimibe (ZETIA) 10 mg, Oral, Daily   Naltrexone HCl, Pain, 4.5 MG CAPS Per psych       Objective:   Physical Exam BP 126/80   Pulse 80   Temp 97.9 F (36.6 C) (Oral)   Resp 16   Ht 6' (1.829 m)   Wt 263 lb 4 oz (119.4 kg)   SpO2 98%   BMI 35.70 kg/m  General:    Well developed, NAD, BMI noted.  HEENT:  Normocephalic . Face symmetric, atraumatic Lungs:  CTA B Normal respiratory effort, no intercostal retractions, no accessory muscle use. Heart: RRR,  no murmur.  Abdomen:  Not distended, soft, non-tender. No rebound or rigidity.   Skin: Not pale. Not jaundice Lower extremities: no pretibial edema bilaterally  Neurologic:  alert & oriented X3.  Speech normal, gait appropriate for age and unassisted Psych--  Cognition and judgment appear intact.  Cooperative with normal attention span and concentration.  Behavior appropriate. No anxious or depressed appearing.     Assessment    ASSESSMENT Asthma  Allergies Hyperlipidemia (atorvastatin: Severe aches) Anxiety (see psych) Sees derm q year d/t FH skin ca (melanoma?) H/o substance abuse (opiods, quit 2011) H/o Fainting with blood drawn  PLAN New patient, to reestablish, last seen 2019. Asthma: Well-controlled with occasional use of Ventolin.  RF sent. Hyperlipidemia: On Zetia only, off atorvastatin d/t  aches and pains, check labs on RTC. Anxiety: Sees psychiatry, on a low-dose of naltrexone.  Does not use any recreational drugs. Allergies: RF Astelin. Erectile Dysfunction: Seen 12/13/2022 by previous PCP checked TSH, LH, FSH, estradiol, testosterone which were  okay. Social: Married, they are having a baby in February 2025. Fainting with blood drawn: Recommend to alert our lab staff every time he gets blood samples RTC/2025 CPX

## 2023-07-31 NOTE — Patient Instructions (Signed)
    Next visit with me by April for a physical exam     Please schedule it at the front desk

## 2023-07-31 NOTE — Assessment & Plan Note (Signed)
New patient, to reestablish, last seen 2019. Asthma: Well-controlled with occasional use of Ventolin.  RF sent. Hyperlipidemia: On Zetia only, off atorvastatin d/t  aches and pains, check labs on RTC. Anxiety: Sees psychiatry, on a low-dose of naltrexone.  Does not use any recreational drugs. Allergies: RF Astelin. Erectile Dysfunction: Seen 12/13/2022 by previous PCP checked TSH, LH, FSH, estradiol, testosterone which were okay. Social: Married, they are having a baby in February 2025. Fainting with blood drawn: Recommend to alert our lab staff every time he gets blood samples RTC/2025 CPX

## 2023-12-01 ENCOUNTER — Encounter: Payer: Self-pay | Admitting: Internal Medicine

## 2023-12-01 ENCOUNTER — Ambulatory Visit (INDEPENDENT_AMBULATORY_CARE_PROVIDER_SITE_OTHER): Payer: 59 | Admitting: Internal Medicine

## 2023-12-01 VITALS — BP 118/80 | HR 98 | Temp 97.7°F | Resp 16 | Ht 72.0 in | Wt 267.5 lb

## 2023-12-01 DIAGNOSIS — Z0000 Encounter for general adult medical examination without abnormal findings: Secondary | ICD-10-CM | POA: Diagnosis not present

## 2023-12-01 DIAGNOSIS — E785 Hyperlipidemia, unspecified: Secondary | ICD-10-CM

## 2023-12-01 DIAGNOSIS — E669 Obesity, unspecified: Secondary | ICD-10-CM | POA: Diagnosis not present

## 2023-12-01 NOTE — Progress Notes (Signed)
 Subjective:    Patient ID: Bryan Taylor, male    DOB: 02/15/1991, 33 y.o.   MRN: [id number]  DOS:  12/01/2023 Type of visit - description: CPX  Feeling well.  Has no major concerns.  Review of Systems     A 14 point review of systems is negative    Past Medical History:  Diagnosis Date   Anxiety    Asthma    Drug abuse (HCC)    Rx opioids, clean since  mid 2011   Erectile dysfunction    Hyperlipidemia    Scoliosis    Vitamin D deficiency     Past Surgical History:  Procedure Laterality Date   WISDOM TOOTH EXTRACTION     Social History   Socioeconomic History   Marital status: Married    Spouse name: Not on file   Number of children: 1   Years of education: Not on file   Highest education level: Not on file  Occupational History   Occupation: consulting job--graduated from [employer]    Employer: [id number]  Tobacco Use   Smoking status: Former    Current packs/day: 0.00    Types: Cigarettes    Quit date: 04/08/2015    Years since quitting: 8.6   Smokeless tobacco: Never   Tobacco comments:    quit   Substance and Sexual Activity   Alcohol use: No    Alcohol/week: 0.0 standard drinks of alcohol   Drug use: No    Comment: no recent    Sexual activity: Yes  Other Topics Concern   Not on file  Social History Narrative   Married, 1 child        Social Drivers of Health   Financial Resource Strain: Low Risk  (04/06/2022)   [facility] visits prior to 10/22/2022.   Overall Financial Resource Strain (CARDIA)    Difficulty of Paying Living Expenses: Not very hard  Food Insecurity: Low Risk  (12/06/2022)   Received from [facility]   Hunger Vital Sign    Worried About Running Out of Food in the Last Year: Never true    Ran Out of Food in the Last Year: Never true  Transportation Needs: Not on file (12/06/2022)  Physical Activity: Sufficiently Active (04/06/2022)   [facility] prior to 10/22/2022.   Exercise Vital Sign    Days of Exercise per Week: 6 days    Minutes of Exercise per Session: 50 min  Stress: Stress Concern Present (04/06/2022)   Received from [facility], [facility] visits prior to 10/22/2022.   [employer] of Occupational Health - Occupational Stress Questionnaire    Feeling of Stress : To some extent  Social Connections: Unknown (04/06/2022)   [facility] visits prior to 10/22/2022., [facility]   Social Connection and Isolation Panel [NHANES]    Frequency of Communication with Friends and Family: More than three times a week    Frequency of Social Gatherings with Friends and Family: Three times a week    Attends Religious Services: Patient declined    Active Member of Clubs or Organizations: Yes    Attends Banker Meetings: More than 4 times per year    Marital Status: Living with partner  Intimate Partner Violence: Not At Risk (04/06/2022)   [facility] visits prior to 10/22/2022.  Humiliation, Afraid, Rape, and Kick questionnaire    Fear of Current or Ex-Partner: No    Emotionally Abused: No    Physically Abused: No    Sexually Abused: No    Current Outpatient Medications  Medication Instructions   albuterol (VENTOLIN HFA) 108 (90 Base) MCG/ACT inhaler 2 puffs, Inhalation, Every 6 hours PRN   azelastine (ASTELIN) 0.1 % nasal spray 2 sprays, Each Nare, 2 times daily   cetirizine (ZYRTEC) 10 mg, Daily   ezetimibe (ZETIA) 10 mg, Oral, Daily   Naltrexone HCl, Pain, 4.5 MG CAPS Per psych       Objective:   Physical Exam BP 118/80   Pulse 98   Temp 97.7 F (36.5 C) (Oral)   Resp 16   Ht 6' (1.829 m)   Wt 267 lb 8 oz (121.3 kg)   SpO2 97%   BMI 36.28 kg/m  General: Well developed, NAD, BMI noted Neck: No  thyromegaly  HEENT:  Normocephalic . Face symmetric, atraumatic Lungs:  CTA  B Normal respiratory effort, no intercostal retractions, no accessory muscle use. Heart: RRR,  no murmur.  Abdomen:  Not distended, soft, non-tender. No rebound or rigidity.   Lower extremities: no pretibial edema bilaterally  Skin: Exposed areas without rash. Not pale. Not jaundice Neurologic:  alert & oriented X3.  Speech normal, gait appropriate for age and unassisted Strength symmetric and appropriate for age.  Psych: Cognition and judgment appear intact.  Cooperative with normal attention span and concentration.  Behavior appropriate. No anxious or depressed appearing.     Assessment    ASSESSMENT Asthma  Allergies Hyperlipidemia (atorvastatin: Severe aches) Anxiety (see psych) Sees derm q year d/t FH skin ca ([facility]) H/o substance abuse (opiods, quit 2011) H/o Fainting with blood drawn  PLAN Here for CPX -Vaccine advice: Tdap, flu shot, COVID booster, PNM 20.  Declined today. -Labs: CMP FLP CBC TSH A1c. -Patient education: We had a long discussion about diet, portion control, decrease carbohydrates. Other issues addressed today: Hyperlipidemia: On Zetia, moderate to severe side effects with atorvastatin.  Recheck labs. Anxiety: Managed elsewhere, seems to be doing well. Obesity: BMI 36, long discussion about diet, he does not like to do any "crash diet", likes to lose weight gradually. Goal decreased 2 pounds a month, reassess on RTC.  We can talk about medications at that time. RTC 4 months.

## 2023-12-01 NOTE — Patient Instructions (Addendum)
 Vaccines I recommend; Covid booster Tdap (tetanus) Prevnar 20 Flu shot every fall        GO TO THE LAB : Get the blood work     Please go to the front desk: Arrange for a follow-up in 4 months

## 2023-12-02 ENCOUNTER — Encounter: Payer: Self-pay | Admitting: Internal Medicine

## 2023-12-02 LAB — HEMOGLOBIN A1C
Hgb A1c MFr Bld: 5.4 %{Hb} (ref <5.7)
Mean Plasma Glucose: 108 mg/dL
eAG (mmol/L): 6 mmol/L

## 2023-12-02 LAB — COMPREHENSIVE METABOLIC PANEL WITH GFR
AG Ratio: 1.7 (calc) (ref 1.0–2.5)
ALT: 20 U/L (ref 9–46)
AST: 19 U/L (ref 10–40)
Albumin: 4.5 g/dL (ref 3.6–5.1)
Alkaline phosphatase (APISO): 60 U/L (ref 36–130)
BUN: 12 mg/dL (ref 7–25)
CO2: 26 mmol/L (ref 20–32)
Calcium: 10 mg/dL (ref 8.6–10.3)
Chloride: 104 mmol/L (ref 98–110)
Creat: 0.85 mg/dL (ref 0.60–1.26)
Globulin: 2.7 g/dL (ref 1.9–3.7)
Glucose, Bld: 92 mg/dL (ref 65–99)
Potassium: 4.2 mmol/L (ref 3.5–5.3)
Sodium: 139 mmol/L (ref 135–146)
Total Bilirubin: 0.5 mg/dL (ref 0.2–1.2)
Total Protein: 7.2 g/dL (ref 6.1–8.1)
eGFR: 118 mL/min/{1.73_m2} (ref >=60)

## 2023-12-02 LAB — CBC WITH DIFFERENTIAL/PLATELET
Absolute Lymphocytes: 2426 {cells}/uL (ref 850–3900)
Absolute Monocytes: 542 {cells}/uL (ref 200–950)
Basophils Absolute: 69 {cells}/uL (ref 0–200)
Basophils Relative: 1.1 %
Eosinophils Absolute: 63 {cells}/uL (ref 15–500)
Eosinophils Relative: 1 %
HCT: 43.9 % (ref 38.5–50.0)
Hemoglobin: 14.7 g/dL (ref 13.2–17.1)
MCH: 28.7 pg (ref 27.0–33.0)
MCHC: 33.5 g/dL (ref 32.0–36.0)
MCV: 85.6 fL (ref 80.0–100.0)
MPV: 12.1 fL (ref 7.5–12.5)
Monocytes Relative: 8.6 %
Neutro Abs: 3200 {cells}/uL (ref 1500–7800)
Neutrophils Relative %: 50.8 %
Platelets: 251 10*3/uL (ref 140–400)
RBC: 5.13 10*6/uL (ref 4.20–5.80)
RDW: 13 % (ref 11.0–15.0)
Total Lymphocyte: 38.5 %
WBC: 6.3 10*3/uL (ref 3.8–10.8)

## 2023-12-02 LAB — LIPID PANEL
Cholesterol: 209 mg/dL — ABNORMAL HIGH (ref <200)
HDL: 44 mg/dL (ref >=40)
LDL Cholesterol (Calc): 140 mg/dL — ABNORMAL HIGH
Non-HDL Cholesterol (Calc): 165 mg/dL — ABNORMAL HIGH (ref <130)
Total CHOL/HDL Ratio: 4.8 (calc) (ref <5.0)
Triglycerides: 127 mg/dL (ref <150)

## 2023-12-02 LAB — TSH: TSH: 1.47 m[IU]/L (ref 0.40–4.50)

## 2023-12-02 NOTE — Assessment & Plan Note (Signed)
 Here for CPX -Vaccine advice: Tdap, flu shot, COVID booster, PNM 20.  Declined today. -Labs: CMP FLP CBC TSH A1c. -Patient education: We had a long discussion about diet, portion control, decrease carbohydrates.

## 2023-12-02 NOTE — Assessment & Plan Note (Signed)
 Here for CPX  Other issues addressed today: Hyperlipidemia: On Zetia, moderate to severe side effects with atorvastatin.  Recheck labs. Anxiety: Managed elsewhere, seems to be doing well. Obesity: BMI 36, long discussion about diet, he does not like to do any "crash diet", likes to lose weight gradually. Goal decreased 2 pounds a month, reassess on RTC.  We can talk about medications at that time. RTC 4 months.

## 2023-12-03 ENCOUNTER — Encounter: Payer: Self-pay | Admitting: Internal Medicine

## 2023-12-04 ENCOUNTER — Other Ambulatory Visit (HOSPITAL_BASED_OUTPATIENT_CLINIC_OR_DEPARTMENT_OTHER): Payer: Self-pay

## 2024-01-09 DIAGNOSIS — D1801 Hemangioma of skin and subcutaneous tissue: Secondary | ICD-10-CM | POA: Diagnosis not present

## 2024-01-09 DIAGNOSIS — L578 Other skin changes due to chronic exposure to nonionizing radiation: Secondary | ICD-10-CM | POA: Diagnosis not present

## 2024-01-09 DIAGNOSIS — L821 Other seborrheic keratosis: Secondary | ICD-10-CM | POA: Diagnosis not present

## 2024-01-09 DIAGNOSIS — D229 Melanocytic nevi, unspecified: Secondary | ICD-10-CM | POA: Diagnosis not present

## 2024-01-09 DIAGNOSIS — Z86018 Personal history of other benign neoplasm: Secondary | ICD-10-CM | POA: Diagnosis not present

## 2024-01-09 DIAGNOSIS — L814 Other melanin hyperpigmentation: Secondary | ICD-10-CM | POA: Diagnosis not present

## 2024-01-25 ENCOUNTER — Other Ambulatory Visit: Payer: Self-pay

## 2024-01-25 ENCOUNTER — Ambulatory Visit (INDEPENDENT_AMBULATORY_CARE_PROVIDER_SITE_OTHER): Admitting: Family Medicine

## 2024-01-25 VITALS — BP 134/76 | HR 82 | Ht 72.0 in | Wt 270.0 lb

## 2024-01-25 DIAGNOSIS — M25511 Pain in right shoulder: Secondary | ICD-10-CM | POA: Diagnosis not present

## 2024-01-25 DIAGNOSIS — G8929 Other chronic pain: Secondary | ICD-10-CM | POA: Diagnosis not present

## 2024-01-25 NOTE — Progress Notes (Signed)
   Bryan Muck, PhD, LAT, ATC acting as a scribe for Bryan Juniper, MD.  Bryan Taylor is a 33 y.o. male who presents to [facility] at [facility] today for R shoulder pain x 1-2 month. He's been doing some pretty heavy weight training for the last 1.5 years. May have been exacerbated by an incline press. Pt locates pain to the anterior aspect of his R shoulder and deep to the distal clavicle.   Radiates:  no Aggravates: nothing in particular Treatments tried: stretches, IBU, Tylenol  He also c/o R elbow pain and mid-to-low back pain.   Pertinent review of systems: No fevers or chills  Relevant historical information: Asthma.  History of drug use clean since 2011.   Exam:  BP 134/76   Pulse 82   Ht 6' (1.829 m)   Wt 270 lb (122.5 kg)   SpO2 98%   BMI 36.62 kg/m  General: Well Developed, well nourished, and in no acute distress.   MSK: Right shoulder normal-appearing normal motion. Intact strength. Positive Hawkins and Neer's test. Positive empty can test. Negative Yergason's and speeds test.    Lab and Radiology Results  Diagnostic Limited MSK Ultrasound of: Right shoulder Biceps tendon is intact. Subscapularis tendon is intact and normal. Supraspinatus tendon is intact with mild subacromial bursitis present. Infraspinatus tendon is intact. AC joint mild effusion. Impression: Subacromial bursitis      Assessment and Plan: 33 y.o. male with right shoulder pain with weightlifting.  Symptoms are consistent with rotator cuff tendinitis.  He is a good candidate for physical therapy.  Discussed modifying lifts to minimize shoulder pain.  Okay to continue lifting weights with modification as long as he feels okay.  We talked about x-ray.  Will hold off on x-ray today.  Recheck if not improving.   PDMP not reviewed this encounter. Orders Placed This Encounter  Procedures   US  LIMITED JOINT SPACE STRUCTURES UP RIGHT(NO LINKED CHARGES)    Reason for  Exam (SYMPTOM  OR DIAGNOSIS REQUIRED):   eval shoulder pain    Preferred imaging location?:    [facility] referral to Physical Therapy    Referral Priority:   Routine    Referral Type:   Physical Medicine    Referral Reason:   Specialty Services Required    Requested Specialty:   Physical Therapy    Number of Visits Requested:   1   No orders of the defined types were placed in this encounter.    Discussed warning signs or symptoms. Please see discharge instructions. Patient expresses understanding.   The above documentation has been reviewed and is accurate and complete Bryan Taylor, M.D.

## 2024-01-25 NOTE — Patient Instructions (Addendum)
 Thank you for coming in today.   A referral for physical therapy has been submitted. A representative from the physical therapy office will contact you to coordinate scheduling after confirming your benefits with your insurance provider. If you do not hear from the physical therapy office within the next 1-2 weeks, please let us  know.   Muscles: Subscapularis and Supraspinatus   See you back as needed.

## 2024-01-26 ENCOUNTER — Encounter: Payer: Self-pay | Admitting: Family Medicine

## 2024-01-26 DIAGNOSIS — G8929 Other chronic pain: Secondary | ICD-10-CM

## 2024-02-12 ENCOUNTER — Other Ambulatory Visit: Payer: Self-pay

## 2024-02-12 ENCOUNTER — Ambulatory Visit: Attending: Family Medicine

## 2024-02-12 DIAGNOSIS — G8929 Other chronic pain: Secondary | ICD-10-CM | POA: Insufficient documentation

## 2024-02-12 DIAGNOSIS — R293 Abnormal posture: Secondary | ICD-10-CM | POA: Diagnosis not present

## 2024-02-12 DIAGNOSIS — M6281 Muscle weakness (generalized): Secondary | ICD-10-CM | POA: Insufficient documentation

## 2024-02-12 DIAGNOSIS — M25511 Pain in right shoulder: Secondary | ICD-10-CM | POA: Insufficient documentation

## 2024-02-12 NOTE — Therapy (Signed)
 OUTPATIENT PHYSICAL THERAPY SHOULDER EVALUATION   Patient Name: Bryan Taylor MRN: [id number] DOB:[birth year], 33 y.o., male Today's Date: 02/12/2024  END OF SESSION:  PT End of Session - 02/12/24 1314     Visit Number 1    Number of Visits 3    Date for PT Re-Evaluation 03/25/24    Authorization Type [employer]    PT Start Time 1057    PT Stop Time 1137    PT Time Calculation (min) 40 min    Activity Tolerance Patient tolerated treatment well    Behavior During Therapy [facility] for tasks assessed/performed          Past Medical History:  Diagnosis Date   Anxiety    Asthma    Drug abuse (HCC)    Rx opioids, clean since  mid 2011   Erectile dysfunction    Hyperlipidemia    Scoliosis    Vitamin D deficiency    Past Surgical History:  Procedure Laterality Date   WISDOM TOOTH EXTRACTION     Patient Active Problem List   Diagnosis Date Noted   Hyperlipidemia 07/31/2023   Non-seasonal allergic rhinitis 07/31/2023   Genital herpes simplex 12/29/2020   Anxiety 09/24/2020   PCP NOTES >>>>>>>>>>>>>>> 11/09/2015   Annual physical exam 03/15/2011   Drug abuse (HCC)    Asthma     PCP: Amon Aloysius BRAVO, MD  REFERRING PROVIDER: Joane Artist RAMAN, MD   REFERRING DIAG: [phone] (ICD-10-CM) - Chronic right shoulder pain   THERAPY DIAG:  Acute pain of right shoulder  Muscle weakness (generalized)  Abnormal posture  Rationale for Evaluation and Treatment: Rehabilitation  ONSET DATE: 2 months  SUBJECTIVE:                                                                                                                                                                                      SUBJECTIVE STATEMENT: Pt presents to PT with roughly 2 month hx of R anterior shoulder pain of insidious onset. Pain began after incline pressing at gym, denies N/T or pain referred down R UE. Wants to get back to weight lifting, does not feel limited during work.   Hand dominance:  Right  PERTINENT HISTORY: Scoliosis   PAIN:  Are you having pain?  Yes: NPRS scale: 1/10 Worst: 4/10 Pain location: R anterior shoulder Pain description: sharp Aggravating factors: bench press, OH reaching Relieving factors: rest  PRECAUTIONS: None  RED FLAGS: None   WEIGHT BEARING RESTRICTIONS: No  FALLS:  Has patient fallen in last 6 months? 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  DIAGNOSTIC FINDINGS:  N/A  PATIENT SURVEYS:  Quick DASH: 25% disability  COGNITION: Overall cognitive status: Within functional limits for tasks assessed     SENSATION: WFL  POSTURE: Rounded shoulders, fwd head  UPPER EXTREMITY ROM:   Active ROM Right eval Left eval  Shoulder flexion WNL WNL  Shoulder extension    Shoulder abduction WNL WNL  Shoulder adduction    Shoulder internal rotation WNL WNL  Shoulder external rotation 45 WNL  Elbow flexion    Elbow extension    Wrist flexion    Wrist extension    Wrist ulnar deviation    Wrist radial deviation    Wrist pronation    Wrist supination    (Blank rows = not tested)  UPPER EXTREMITY MMT:  MMT Right eval Left eval  Shoulder flexion 4 5  Shoulder extension    Shoulder abduction 3+ 4  Shoulder adduction    Shoulder internal rotation 5 5  Shoulder external rotation 4 5  Middle trapezius    Lower trapezius    Elbow flexion    Elbow extension    Wrist flexion    Wrist extension    Wrist ulnar deviation    Wrist radial deviation    Wrist pronation    Wrist supination    Grip strength (lbs)    (Blank rows = not tested)  SHOULDER SPECIAL TESTS: Impingement tests: Painful arc test: positive   JOINT MOBILITY  TESTING:  Normal   PALPATION:  Slight TTP to R infraspinatus                                                                                                                            TREATMENT: [facility] Adult PT Treatment:                                                DATE: 02/12/2024 Therapeutic Exercise: Prone IYT x 5 ea Corner pec stretch x 30  R shoulder ER x 10 GTB Open book 90/90 pec stretch x 30 with towels   PATIENT EDUCATION: Education details: eval findings, Quick DASH, HEP, POC Person educated: Patient Education method: Explanation, Demonstration, and Handouts Education comprehension: verbalized understanding and returned demonstration  HOME EXERCISE PROGRAM: Access Code: [email or web address] URL: [email or web address] Date: 02/12/2024 Prepared by: Alm Kingdom  Exercises - Prone Scapular Retraction Y  - 1 x daily - 7 x weekly - 2 sets - 10 reps - Prone Shoulder Horizontal Abduction with Thumbs Up  - 1 x daily - 7 x weekly - 2 sets - 10 reps - Prone I  - 1 x daily - 7 x weekly - 2  sets - 10 reps - Corner Pec Major Stretch  - 1 x daily - 7 x weekly - 2-3 reps - 30 sec hold - Shoulder External Rotation with Anchored Resistance  - 1 x daily - 7 x weekly - 3 sets - 10 reps - green band or 5-7lbs hold - Open Book Chest Rotation Stretch on Foam 1/2 Roll  - 1 x daily - 7 x weekly - 2-3 reps - 30 sec hold  ASSESSMENT:  CLINICAL IMPRESSION: Patient is a 33 y.o. M who was seen today for physical therapy evaluation and treatment for acute on chronic R shoulder pain. Physical findings are consistent with MD impression as pt demonstrates decrease in R shoulder RTC strength and function. Quick DASH score indicates moderate disability in performance of home ADLs and community activities. Pt would benefit from skilled PT services working on improving RTC stability and periscapular strength in order to decrease pain and improve comfort.   OBJECTIVE IMPAIRMENTS: decreased activity  tolerance, decreased strength, postural dysfunction, and pain.   ACTIVITY LIMITATIONS: carrying, lifting, and reach over head  PARTICIPATION LIMITATIONS: community activity and weightlifting  PERSONAL FACTORS: Time since onset of injury/illness/exacerbation are also affecting patient's functional outcome.   REHAB POTENTIAL: Excellent  CLINICAL DECISION MAKING: Stable/uncomplicated  EVALUATION COMPLEXITY: Low   GOALS: Goals reviewed with patient? No  SHORT TERM GOALS: Target date: 03/04/2024   Pt will be compliant and knowledgeable with initial HEP for improved comfort and carryover Baseline: initial HEP given  Goal status: INITIAL  2.  Pt will self report right shoulder pain no greater than 2/10 for improved comfort and functional ability Baseline: 4/10 at worst Goal status: INITIAL   LONG TERM GOALS: Target date: 03/25/2024  Pt will decrease Quick DASH disability score to no greater than 10% as proxy for functional improvement Baseline: 25% disability  Goal status: INITIAL  2.  Pt will self report right shoulder pain no greater than 0/10 for improved comfort and functional ability Baseline: 4/10 at worst Goal status: INITIAL   3.  Pt will improve R shoulder MMT to no less than 5/5 for improved dynamic stability especially with pushing and OH lifting Baseline: see MMT chart Goal status: INITIAL  4.  Pt will be able to return to full weight lifting activities with no increase in right shoulder pain for improved return to desired functional activity Baseline: unable to perform bench press Goal status: INITIAL   PLAN:  PT FREQUENCY: every other week  PT DURATION: 6 weeks  PLANNED INTERVENTIONS: 97164- PT Re-evaluation, 97110-Therapeutic exercises, 97530- Therapeutic activity, 97112- Neuromuscular re-education, 97535- Self Care, 02859- Manual therapy, [id number]- Electrical stimulation (unattended), [id number]- Electrical stimulation (manual), 20560 (1-2 muscles), 20561 (3+  muscles)- Dry Needling, Cryotherapy, and Moist heat  PLAN FOR NEXT SESSION: assess HEP response, progress posterior shoulder and periscapular strength, pec wall stretching   Alm JAYSON Kingdom, PT 02/12/2024, 1:15 PM

## 2024-03-04 ENCOUNTER — Ambulatory Visit: Attending: Family Medicine

## 2024-03-27 ENCOUNTER — Ambulatory Visit: Admitting: Internal Medicine

## 2024-03-29 ENCOUNTER — Other Ambulatory Visit: Payer: Self-pay | Admitting: Internal Medicine

## 2024-03-29 ENCOUNTER — Other Ambulatory Visit (HOSPITAL_BASED_OUTPATIENT_CLINIC_OR_DEPARTMENT_OTHER): Payer: Self-pay

## 2024-03-29 ENCOUNTER — Ambulatory Visit: Admitting: Internal Medicine

## 2024-03-29 MED ORDER — EZETIMIBE 10 MG PO TABS
10.0000 mg | ORAL_TABLET | Freq: Every day | ORAL | 1 refills | Status: DC
  Filled 2024-03-29: qty 90, 90d supply, fill #0

## 2024-04-16 ENCOUNTER — Ambulatory Visit: Admitting: Internal Medicine

## 2024-04-25 DIAGNOSIS — F1111 Opioid abuse, in remission: Secondary | ICD-10-CM | POA: Diagnosis not present

## 2024-04-25 DIAGNOSIS — F900 Attention-deficit hyperactivity disorder, predominantly inattentive type: Secondary | ICD-10-CM | POA: Diagnosis not present

## 2024-04-25 DIAGNOSIS — F411 Generalized anxiety disorder: Secondary | ICD-10-CM | POA: Diagnosis not present

## 2024-06-10 ENCOUNTER — Encounter: Payer: Self-pay | Admitting: Internal Medicine

## 2024-06-10 ENCOUNTER — Other Ambulatory Visit (HOSPITAL_BASED_OUTPATIENT_CLINIC_OR_DEPARTMENT_OTHER): Payer: Self-pay

## 2024-06-10 ENCOUNTER — Ambulatory Visit: Admitting: Internal Medicine

## 2024-06-10 VITALS — BP 136/80 | HR 76 | Temp 98.0°F | Resp 16 | Ht 72.0 in | Wt 262.0 lb

## 2024-06-10 DIAGNOSIS — E785 Hyperlipidemia, unspecified: Secondary | ICD-10-CM

## 2024-06-10 DIAGNOSIS — Z6834 Body mass index (BMI) 34.0-34.9, adult: Secondary | ICD-10-CM | POA: Diagnosis not present

## 2024-06-10 DIAGNOSIS — E669 Obesity, unspecified: Secondary | ICD-10-CM | POA: Insufficient documentation

## 2024-06-10 DIAGNOSIS — E6609 Other obesity due to excess calories: Secondary | ICD-10-CM

## 2024-06-10 DIAGNOSIS — E66811 Obesity, class 1: Secondary | ICD-10-CM | POA: Diagnosis not present

## 2024-06-10 DIAGNOSIS — J4520 Mild intermittent asthma, uncomplicated: Secondary | ICD-10-CM

## 2024-06-10 MED ORDER — AZELASTINE HCL 0.1 % NA SOLN
2.0000 | Freq: Two times a day (BID) | NASAL | 6 refills | Status: AC
  Filled 2024-06-10: qty 30, 50d supply, fill #0

## 2024-06-10 MED ORDER — EZETIMIBE 10 MG PO TABS
10.0000 mg | ORAL_TABLET | Freq: Every day | ORAL | 3 refills | Status: AC
  Filled 2024-06-10: qty 90, 90d supply, fill #0

## 2024-06-10 NOTE — Assessment & Plan Note (Signed)
 Obesity BMI 35. Lost 8 pounds with dietary changes and exercise. Prefers lifestyle changes over weight loss w/ GLP-1's - Continue dietary modifications with portion control and quality focus. - Encourage regular exercise, including weight lifting and walking. - Advise caution with holiday eating to prevent weight regain. Hyperlipidemia Managed with Ezetimibe  due to statin intolerance. Cholesterol levels improving. Refill Ezetimibe  prescription. Allergic rhinitis Managed with Azelastine  and Cetirizine prn. Symptoms improved, RF Azelastine  nasal spray prescription. Asthma: Doing well, on albuterol  as needed, is his observation that azelastine  for allergic rhinitis decrease his asthma symptoms Preventive care: Declined a flu shot, benefit discussed. RTC 6 months CPX

## 2024-06-10 NOTE — Progress Notes (Signed)
 Subjective:    Patient ID: Bryan Taylor, male    DOB: 07/29/1991, 33 y.o.   MRN: [id number]  DOS:  06/10/2024 Follow-up  Discussed the use of AI scribe software for clinical note transcription with the patient, who gave verbal consent to proceed.  History of Present Illness  Weight management and dietary modification - Weight loss of 8 pounds through dietary changes, including reduced sugar and carbohydrate intake and portion control - Maintains focus on weight management despite challenges from frequent travel - Committed to sustainable lifestyle changes  Physical activity - Engages in regular exercise, lifting weights twice weekly and walking daily - Goal to increase gym attendance to three times per week  Hyperlipidemia and medication intolerance - Takes Zetia  for cholesterol management due to myalgias with prior statin therapy  Asthma and allergic symptoms - Asthma managed with as-needed inhaler, with decreased frequency of use recently - Uses allergy medications, including Astelin  - No longer takes Zyrtec daily   Wt Readings from Last 3 Encounters:  06/10/24 262 lb (118.8 kg)  01/25/24 270 lb (122.5 kg)  12/01/23 267 lb 8 oz (121.3 kg)    Review of Systems See above   Past Medical History:  Diagnosis Date   Anxiety    Asthma    Drug abuse (HCC)    Rx opioids, clean since  mid 2011   Erectile dysfunction    Hyperlipidemia    Scoliosis    Vitamin D deficiency     Past Surgical History:  Procedure Laterality Date   WISDOM TOOTH EXTRACTION      Current Outpatient Medications  Medication Instructions   albuterol  (VENTOLIN  HFA) 108 (90 Base) MCG/ACT inhaler 2 puffs, Inhalation, Every 6 hours PRN   azelastine  (ASTELIN ) 0.1 % nasal spray 2 sprays, Each Nare, 2 times daily   cetirizine (ZYRTEC) 10 mg, Daily   ezetimibe  (ZETIA ) 10 mg, Oral, Daily   Naltrexone  HCl, Pain, 4.5 MG CAPS Per psych       Objective:   Physical Exam BP 136/80   Pulse 76    Temp 98 F (36.7 C) (Oral)   Resp 16   Ht 6' (1.829 m)   Wt 262 lb (118.8 kg)   SpO2 96%   BMI 35.53 kg/m  General:   Well developed, NAD, BMI noted. HEENT:  Normocephalic . Face symmetric, atraumatic   Skin: Not pale. Not jaundice Neurologic:  alert & oriented X3.  Speech normal, gait appropriate for age and unassisted Psych--  Cognition and judgment appear intact.  Cooperative with normal attention span and concentration.  Behavior appropriate. No anxious or depressed appearing.      Assessment     ASSESSMENT Asthma  Allergies Hyperlipidemia (atorvastatin: Severe aches) Anxiety (see psych) Sees derm q year d/t FH skin ca ([facility]) H/o substance abuse (opiods, quit 2011) H/o Fainting with blood drawn   Assessment & Plan Obesity BMI 35. Lost 8 pounds with dietary changes and exercise. Prefers lifestyle changes over weight loss w/ GLP-1's - Continue dietary modifications with portion control and quality focus. - Encourage regular exercise, including weight lifting and walking. - Advise caution with holiday eating to prevent weight regain. Hyperlipidemia Managed with Ezetimibe  due to statin intolerance. Cholesterol levels improving. Refill Ezetimibe  prescription. Allergic rhinitis Managed with Azelastine  and Cetirizine prn. Symptoms improved, RF Azelastine  nasal spray prescription. Asthma: Doing well, on albuterol  as needed, is his observation that azelastine  for allergic rhinitis decrease his asthma symptoms Preventive care: Declined a flu shot, benefit discussed. RTC  6 months CPX

## 2024-06-10 NOTE — Patient Instructions (Addendum)
 Go to the front desk for the checkout Please make an appointment  for a physical exam by 11/2024

## 2024-06-12 ENCOUNTER — Other Ambulatory Visit (HOSPITAL_BASED_OUTPATIENT_CLINIC_OR_DEPARTMENT_OTHER): Payer: Self-pay

## 2024-12-09 ENCOUNTER — Encounter: Admitting: Internal Medicine
